# Patient Record
Sex: Male | Born: 1965 | Race: White | Hispanic: No | Marital: Married | Smoking: Never smoker
Health system: Southern US, Community
[De-identification: ages and names within clinical notes are randomized; demographics above are authoritative.]

## PROBLEM LIST (undated history)

## (undated) DIAGNOSIS — N200 Calculus of kidney: Secondary | ICD-10-CM

## (undated) DIAGNOSIS — N400 Enlarged prostate without lower urinary tract symptoms: Secondary | ICD-10-CM

## (undated) DIAGNOSIS — Z87442 Personal history of urinary calculi: Secondary | ICD-10-CM

## (undated) HISTORY — PX: LITHOTRIPSY: SUR834

---

## 2005-01-25 ENCOUNTER — Emergency Department: Payer: Self-pay | Admitting: Emergency Medicine

## 2005-03-21 ENCOUNTER — Ambulatory Visit: Payer: Self-pay | Admitting: Specialist

## 2006-04-20 ENCOUNTER — Emergency Department: Payer: Self-pay | Admitting: Emergency Medicine

## 2006-07-17 IMAGING — CR DG ABDOMEN 1V
1 series · 1 of 1 positions shown · non-contrast
Comparison: none

REASON FOR EXAM: renal calculi-lithotripsy
COMMENTS:

[view not recorded]
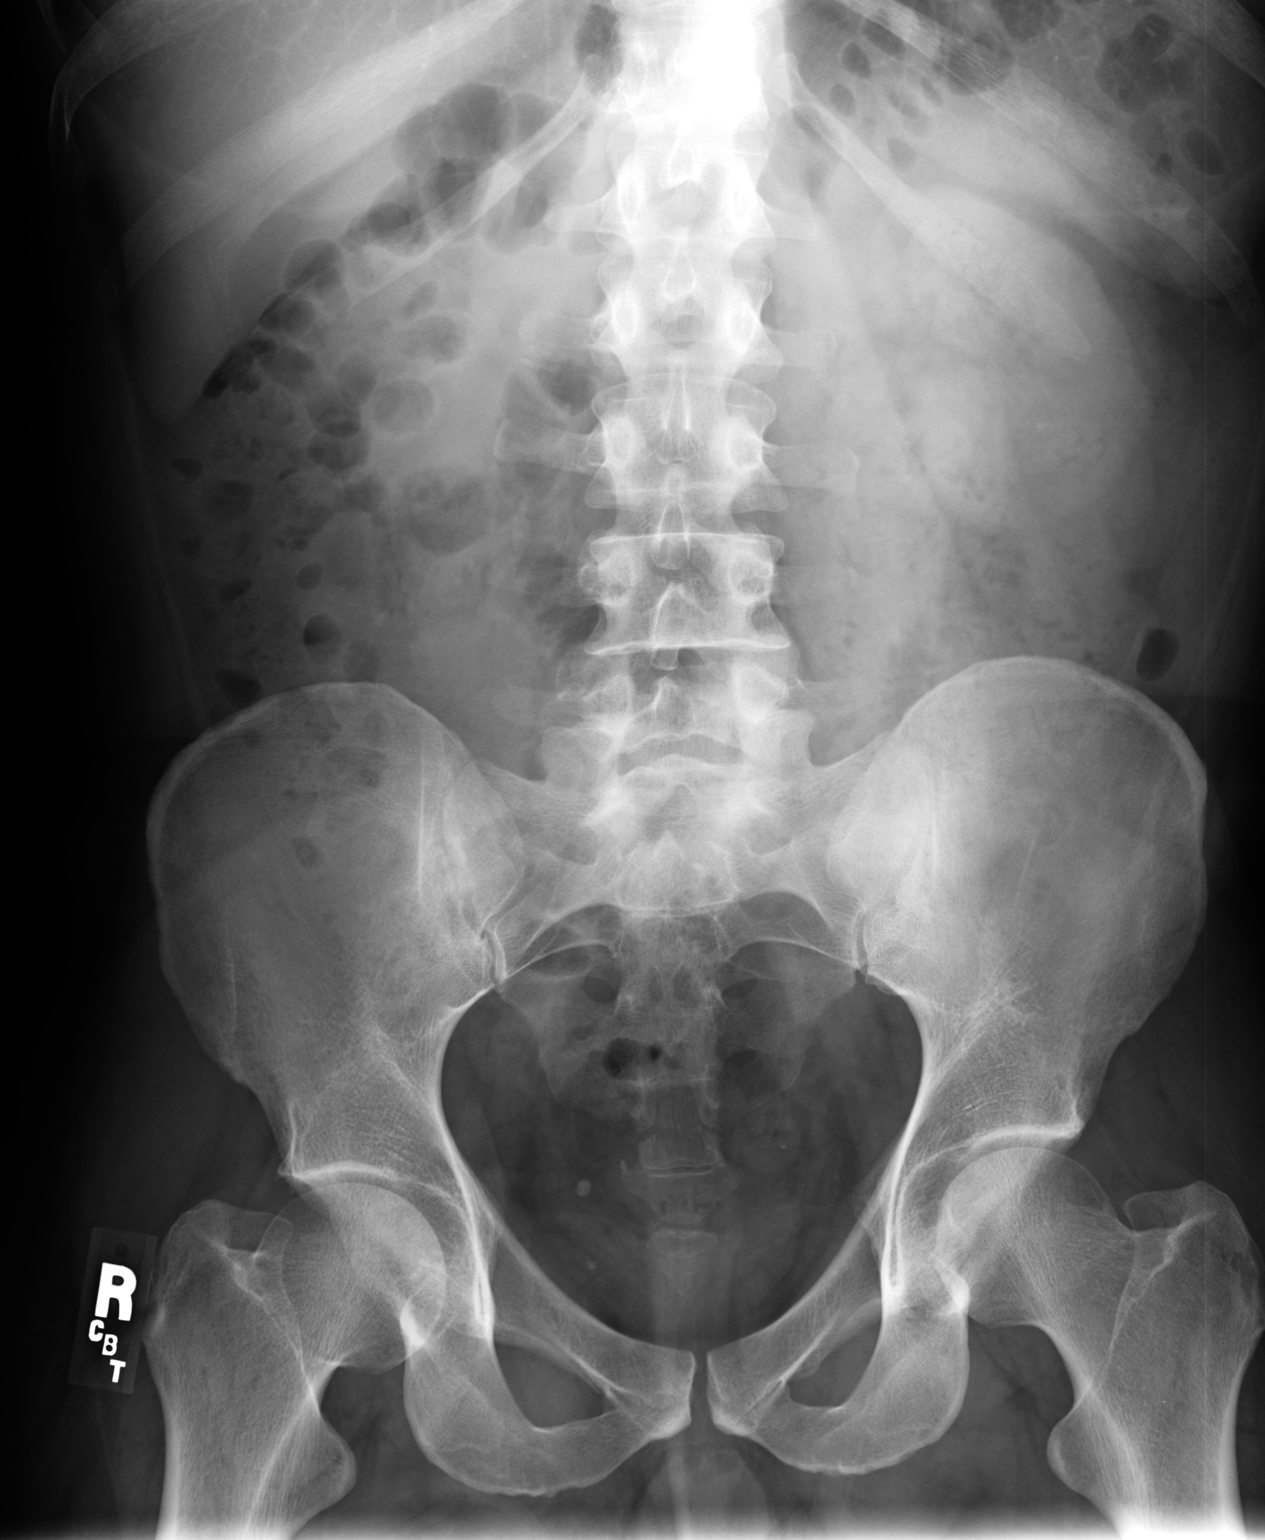

[1 of 1 positions shown; findings below may reference images not displayed]

PROCEDURE:     DXR - DXR KIDNEY URETER BLADDER  - March 21, 2005 [DATE]

RESULT:     A single image demonstrates some calcific densities over the
pelvic region. A definite stone is not demonstrated over the course of the
RIGHT ureter proximally.  The RIGHT kidney is essentially obscured by
overlying bowel content. The LEFT kidney appears normal. The bony structures
are unremarkable. Calcific density over the RIGHT pelvic region could
represent a distal RIGHT ureteral stone. Correlation can be made with CT for
further evaluation if desired.
IMPRESSION: 1)See above.

## 2007-04-20 ENCOUNTER — Ambulatory Visit: Payer: Self-pay | Admitting: Specialist

## 2008-04-19 ENCOUNTER — Ambulatory Visit: Payer: Self-pay | Admitting: Specialist

## 2014-03-25 ENCOUNTER — Ambulatory Visit: Payer: Self-pay | Admitting: Gastroenterology

## 2015-03-15 ENCOUNTER — Ambulatory Visit: Payer: 59 | Attending: Orthopedic Surgery | Admitting: Occupational Therapy

## 2015-03-15 DIAGNOSIS — G5602 Carpal tunnel syndrome, left upper limb: Secondary | ICD-10-CM | POA: Diagnosis present

## 2015-03-15 DIAGNOSIS — G5601 Carpal tunnel syndrome, right upper limb: Secondary | ICD-10-CM | POA: Insufficient documentation

## 2015-03-15 DIAGNOSIS — G5603 Carpal tunnel syndrome, bilateral upper limbs: Secondary | ICD-10-CM

## 2015-03-15 NOTE — Therapy (Signed)
[place] [facility] [address] [place], [address] Phone: [phone]   Fax:  [phone]  Occupational Therapy Treatment  Patient Details  Name: Jeremy Castillo MRN: [id number] Date of Birth: 1966-06-27 Referring Provider:  Kennedy Bucker, MD  Encounter Date: 03/15/2015      OT End of Session - 03/15/15 1207    Visit Number 1   Number of Visits 4   Date for OT Re-Evaluation 04/05/15   OT Start Time 0916   OT Stop Time 1029   OT Time Calculation (min) 73 min   Activity Tolerance Patient tolerated treatment well   Behavior During Therapy [facility] for tasks assessed/performed      No past medical history on file.  No past surgical history on file.  There were no vitals filed for this visit.  Visit Diagnosis:  Bilateral carpal tunnel syndrome - Plan: Ot plan of care cert/re-cert      Subjective Assessment - 03/15/15 1128    Subjective  Had it some what about more than 7 yrs - remember in 2009 I had nerve conduction test    Patient Stated Goals I do not want it to get worse and loose strength or have permanent damage - so Dr Rosita Kea said to do therapy fist and see    Currently in Pain? 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 - did order him wrist support for computer and adjusted chair - notice it mostly sleeping, holding steering wheel , lawn mower, weed eater, driving, shaving    Strength   Right Hand Grip (lbs) 86   Right Hand Lateral Pinch 22 lbs   Right Hand 3 Point Pinch 25 lbs   Left Hand Grip (lbs) 80   Left Hand Lateral Pinch 20 lbs   Left Hand 3 Point Pinch 21 lbs                  OT Treatments/Exercises (OP) - 03/15/15 0001    ADLs   ADL Comments see pt instructions                 OT Education - 03/15/15 1206    Education Details modifications and HEP   Person(s) Educated Patient   Methods Explanation;Demonstration;Tactile cues;Verbal cues;Handout   Comprehension Tactile cues required;Verbal cues required;Returned demonstration;Verbalized understanding          OT Short Term Goals - 03/15/15 1509    OT SHORT TERM GOAL #1   Title Pt to be ind in homeprogram to decrease CTS in bilateral hands    Baseline no knowledge on heP   Time 3   Period Weeks   Status New           OT Long Term Goals - 03/15/15 1510    OT LONG  TERM GOAL #1   Title Pt to verbalize and demo 3 modifications to tasks he did at home or work to decrease symptoms    Time 3   Period Weeks   Status New               Plan - 03/15/15 1208    Clinical Impression Statement Pt present with bilateral CTS that he had for about since 2009  - but got worse the last few months since March - he did do some remodeling at home and had some elbow pain for while and did a lot of sanding, painting - pt has positive test for Phalens , Tinel for Cubital tunnel - pt AROM at wrist and digits WNL - grip  WFL but compare to L decrease - pt report no functional issue and pain at the worse maybe 3/10 = pt was ed on modification of tasks,  what positions to avoid ,  and HEP to do for week and will reassess   Pt will benefit from skilled therapeutic intervention in order to improve on the following deficits (Retired) Pain;Impaired UE functional use;Impaired sensation   Rehab Potential Good   OT Frequency 1x / week   OT Duration 4 weeks   Plan Reassses symptoms for bil  CTS; and HEP   OT Home Exercise Plan see pt instruction   Consulted and Agree with Plan of Care Patient        Problem List There are no active problems to display for this patient.   Oletta Cohn OTR/L,CLT 03/15/2015, 3:14 PM  [place] [facility] [address] [place], [address] Phone: [phone]   Fax:  [phone]

## 2015-03-15 NOTE — Patient Instructions (Signed)
Contrast bath 3 min heat/ 1 min cold x 2-3 and then heat again  Tendon gliding 10 reps Med N and Ulnar  N glides 3-5 reps   Reviewed act and changes to avoid or make to decrease compression on nerve Cubital tunnel - pressure on tunnel; repetitive flexion and extention of elbow Flexion more than 90 degrees - sleeping, resting or reading   CTS - tight and sustained grip - built up handles Grip with flex wrist  Vibration

## 2015-03-23 ENCOUNTER — Ambulatory Visit: Payer: 59 | Attending: Orthopedic Surgery | Admitting: Occupational Therapy

## 2015-03-23 DIAGNOSIS — G5601 Carpal tunnel syndrome, right upper limb: Secondary | ICD-10-CM | POA: Insufficient documentation

## 2015-03-23 DIAGNOSIS — G5603 Carpal tunnel syndrome, bilateral upper limbs: Secondary | ICD-10-CM

## 2015-03-23 DIAGNOSIS — G5602 Carpal tunnel syndrome, left upper limb: Secondary | ICD-10-CM | POA: Diagnosis present

## 2015-03-23 NOTE — Therapy (Signed)
[place] [facility] [address] [place], [address] Phone: [phone]   Fax:  [phone]  Occupational Therapy Treatment  Patient Details  Name: Jeremy Castillo MRN: [id number] Date of Birth: [birth year] Referring Provider:  Kennedy Bucker, MD  Encounter Date: 03/23/2015      OT End of Session - 03/23/15 0946    Visit Number 2   Number of Visits 4   Date for OT Re-Evaluation 04/05/15   OT Start Time 0805   OT Stop Time 0846   OT Time Calculation (min) 41 min   Activity Tolerance Patient tolerated treatment well   Behavior During Therapy [facility] for tasks assessed/performed      No past medical history on file.  No past surgical history on file.  There were no vitals filed for this visit.  Visit Diagnosis:  Bilateral carpal tunnel syndrome      Subjective Assessment - 03/23/15 0940    Subjective  I think I can tell difference - little better - it is just hard to fit in the contrast  in the am - but the water works better than the heatingpad/cold pack - I did get the gel wrist supports for my computer at work    Patient Stated Goals I do not want it to get worse and loose strength or have permanent damage - so Dr Rosita Kea said to do therapy fist and see    Currently in Pain? No/denies                      OT Treatments/Exercises (OP) - 03/23/15 0001    ADLs   ADL Comments Review with pt  modifications for limiiting compression on cubital tunnel and CT    Hand Exercises   Other Hand Exercises Tendon glides 10 reps  , Med and Ulnar N glides 5 reps    Other Hand Exercises Needed min A for glides to do correct    RUE Contrast Bath   Time 11 minutes   Comments to decrease pain and inflammation   LUE Contrast Bath   Time 11 minutes   Comments decrease pain and inflamation   Manual Therapy   Manual therapy comments Carpal spreads done to decrease compresion prior to tendonglides - after contrast                 OT Education - 03/23/15 0946    Education provided Yes   Education Details HEP and modifications    Person(s) Educated Patient   Methods Explanation;Demonstration;Tactile cues;Verbal cues   Comprehension Verbal cues required;Returned demonstration;Verbalized understanding          OT Short Term Goals - 03/15/15 1509    OT SHORT TERM GOAL #1   Title Pt to be ind in homeprogram to decrease CTS in bilateral hands    Baseline no knowledge on heP   Time 3   Period Weeks   Status New           OT Long Term Goals - 03/15/15 1510    OT LONG TERM GOAL #1   Title Pt to verbalize and demo 3 modifications to tasks he did at home or work to decrease symptoms    Time 3   Period Weeks   Status New               Plan - 03/23/15 0946    Clinical Impression Statement Pt report he could feel some improvement,  not a lot and just got his computer gel wrist supports 2 days ago, did not get gloves for cutting lawn yet - ddi need some min A for doing HEP correctly - pt to cont with HEP and modifications for 2 wks and will reassess   Pt will benefit from skilled therapeutic intervention in order to improve on the following deficits (Retired) Pain;Impaired UE functional use;Impaired sensation   Rehab Potential Good   OT Frequency Biweekly   OT Duration 4 weeks   Plan Reassess symptoms if reduce and HEP    OT Home Exercise Plan see pt instruction   Consulted and Agree with Plan of Care Patient        Problem List There are no active problems to display for this patient.   Rayven Hendrickson OTR/L, CLT 03/23/2015, 9:49 AM  [place] [facility] [address], [place], [address] Phone: [phone]   Fax:  [phone]

## 2015-03-23 NOTE — Patient Instructions (Signed)
Same tendon glides and Med and Ulnar N glides   Add carpal spreads prior to ROM   Cont with modifications and contrast

## 2015-03-24 NOTE — Therapy (Signed)
[place] [facility] [address], [place], [address] Phone: [phone]   Fax:  [phone]  Occupational Therapy Treatment  Patient Details  Name: Jeremy Castillo MRN: [id number] Date of Birth: 1965/10/13 Referring Provider:  Kennedy Bucker, MD  Encounter Date: 03/23/2015      OT End of Session - 03/23/15 0946    Visit Number 2   Number of Visits 4   Date for OT Re-Evaluation 04/05/15   OT Start Time 0805   OT Stop Time 0846   OT Time Calculation (min) 41 min   Activity Tolerance Patient tolerated treatment well   Behavior During Therapy [facility] for tasks assessed/performed      No past medical history on file.  No past surgical history on file.  There were no vitals filed for this visit.  Visit Diagnosis:  Bilateral carpal tunnel syndrome      Subjective Assessment - 03/23/15 0940    Subjective  I think I can tell difference - little better - it is just hard to fit in the contrast  in the am - but the water works better than the heatingpad/cold pack - I did get the gel wrist supports for my computer at work    Patient Stated Goals I do not want it to get worse and loose strength or have permanent damage - so Dr Rosita Kea said to do therapy fist and see    Currently in Pain? No/denies                              OT Education - 03/23/15 0946    Education provided Yes   Education Details HEP and modifications    Person(s) Educated Patient   Methods Explanation;Demonstration;Tactile cues;Verbal cues   Comprehension Verbal cues required;Returned demonstration;Verbalized understanding          OT Short Term Goals - 03/15/15 1509    OT SHORT TERM GOAL #1   Title Pt to be ind in homeprogram to decrease CTS in bilateral hands    Baseline no knowledge on heP   Time 3   Period Weeks   Status New           OT Long Term Goals - 03/15/15 1510    OT LONG TERM GOAL #1   Title Pt to  verbalize and demo 3 modifications to tasks he did at home or work to decrease symptoms    Time 3   Period Weeks   Status New               Plan - 03/23/15 0946    Clinical Impression Statement Pt report he could feel some improvement, not a lot and just got his computer gel wrist supports 2 days ago, did not get gloves for cutting lawn yet - ddi need some min A for doing HEP correctly - pt to cont with HEP and modifications for 2 wks and will reassess   Pt will benefit from skilled therapeutic intervention in order to improve on the following deficits (Retired) Pain;Impaired UE functional use;Impaired sensation   Rehab Potential Good   OT Frequency Biweekly   OT Duration 4 weeks   Plan Reassess symptoms if reduce and HEP    OT Home Exercise Plan see pt instruction   Consulted and Agree with Plan of Care Patient       Billing need to be corrected - code [id number]  need to by 8 min instead of 78 min And 1 charge instead of 8 charges  Problem List There are no active problems to display for this patient.   Oletta Cohn 03/24/2015, 12:32 PM  [place] [facility] [address], [place], [address] Phone: [phone]   Fax:  [phone]

## 2015-04-06 ENCOUNTER — Ambulatory Visit: Payer: 59 | Admitting: Occupational Therapy

## 2015-04-06 DIAGNOSIS — G5603 Carpal tunnel syndrome, bilateral upper limbs: Secondary | ICD-10-CM

## 2015-04-06 DIAGNOSIS — G5601 Carpal tunnel syndrome, right upper limb: Secondary | ICD-10-CM | POA: Diagnosis not present

## 2015-04-06 NOTE — Therapy (Signed)
[place] [facility] [address] [place], [address] Phone: [phone]   Fax:  [phone]  Occupational Therapy Treatment  Patient Details  Name: Jeremy Castillo MRN: [id number] Date of Birth: April 01, 1966 Referring Provider:  Kennedy Bucker, MD  Encounter Date: 04/06/2015      OT End of Session - 04/06/15 1442    Visit Number 3   Number of Visits 4   Date for OT Re-Evaluation 04/27/15   OT Start Time 0802   OT Stop Time 0845   OT Time Calculation (min) 43 min   Activity Tolerance Patient tolerated treatment well   Behavior During Therapy [facility] for tasks assessed/performed      No past medical history on file.  No past surgical history on file.  There were no vitals filed for this visit.  Visit Diagnosis:  Bilateral carpal tunnel syndrome - Plan: Ot plan of care cert/re-cert      Subjective Assessment - 04/06/15 1353    Subjective  I was doing great until yesterday when I start feeling little achiness in R wrist and tightness in the forearm -  I  did get the gelpad for my keyboard and mouse -and did not do to much over the weekend - like cutting the lawn or  house work stuff    Patient Stated Goals I do not want it to get worse and loose strength or have permanent damage - so Dr Rosita Kea said to do therapy fist and see    Currently in Pain? No/denies            [facility] OT Assessment - 04/06/15 0001    Strength   Right Hand Grip (lbs) 75   Right Hand Lateral Pinch 19 lbs   Right Hand 3 Point Pinch 21 lbs   Left Hand Grip (lbs) 0   Left Hand Lateral Pinch 25 lbs   Left Hand 3 Point Pinch 20 lbs                  OT Treatments/Exercises (OP) - 04/06/15 0001    ADLs   ADL Comments Review modifications for Cubital tunnel  and CT -   Wrist Exercises   Other wrist exercises Prayer stretch add for tightness on palmar sdie of forearm - wirst extention some what limited in both wrist    Hand  Exercises   Other Hand Exercises Tendon glides 10 reps  , Med and Ulnar N glides 5 reps    Other Hand Exercises Assess strenght to thenar eminence and assess if any atrophy ; grip and prehension assess    Manual Therapy   Manual therapy comments Carpal spreads done to decrease compresion prior to tendonglides - after contrast                OT Education - 04/06/15 1440    Education provided Yes   Education Details HEP and modification   Person(s) Educated Patient   Methods Explanation;Demonstration;Tactile cues;Verbal cues   Comprehension Verbalized understanding;Returned demonstration;Verbal cues required;Tactile cues required          OT Short Term Goals - 04/06/15 1445    OT SHORT TERM GOAL #1   Title Pt to be ind in homeprogram to decrease CTS in bilateral hands    Baseline implementing gradually    Time 3   Period Weeks   Status On-going           OT Long Term Goals - 04/06/15  1446    OT LONG TERM GOAL #1   Title Pt to verbalize and demo 3 modifications to tasks he did at home or work to decrease symptoms    Status Achieved               Plan - 04/06/15 1443    Clinical Impression Statement Pt report feeling some pressure at CT on R more than L , and tightness over flexors in forearm - assess thumb thenar eminence muscles - good stretnght - now it was more rainy the last 2-3 days - could be weather - added some wrist flexor stretch to HEP - pt to cont with contrast , splints nad tendonglides/med N glides - as well as modificastion for  cubital tunnel - pt to cont with home program and changes for 3 wks and will reassess   Pt will benefit from skilled therapeutic intervention in order to improve on the following deficits (Retired) Pain;Impaired UE functional use;Impaired sensation   Rehab Potential Good   OT Frequency Biweekly   OT Duration 4 weeks   Plan Reassess symptoms and changes made    OT Home Exercise Plan see pt instruction   Consulted and  Agree with Plan of Care Patient        Problem List There are no active problems to display for this patient.   Oletta Cohn OTR/L,CLT 04/06/2015, 2:55 PM  [place] [facility] SPORTS MEDIC[facility] [address], [place], [address] Phone: [phone]   Fax:  [phone]

## 2015-04-06 NOTE — Patient Instructions (Signed)
Add wrist flexor stretch and cont with same HEP - splint wearing and modifications

## 2015-04-27 ENCOUNTER — Ambulatory Visit: Payer: 59 | Attending: Orthopedic Surgery | Admitting: Occupational Therapy

## 2015-04-27 DIAGNOSIS — G5603 Carpal tunnel syndrome, bilateral upper limbs: Secondary | ICD-10-CM | POA: Insufficient documentation

## 2015-04-27 NOTE — Patient Instructions (Signed)
See ADL's note for education for modifications   Tendon glides, Med N ulnar N glides   Contrast only when flare up  Sleep with CT splints

## 2015-04-27 NOTE — Therapy (Signed)
[place] [facility] [address] [place], [place], [address] Phone: [phone]   Fax:  [phone]  Occupational Therapy Treatment and discharge  Patient Details  Name: Jeremy Castillo MRN: [id number] Date of Birth: 07/31/1965 Referring Provider:  Kennedy BuckerMenz, Michael, MD  Encounter Date: 04/27/2015      OT End of Session - 04/27/15 1030    Visit Number 4   Number of Visits 4   Date for OT Re-Evaluation 04/27/15   OT Start Time 0810   OT Stop Time 0844   OT Time Calculation (min) 34 min   Activity Tolerance Patient tolerated treatment well   Behavior During [facility] for tasks assessed/performed      No past medical history on file.  No past surgical history on file.  There were no vitals filed for this visit.  Visit Diagnosis:  Bilateral carpal tunnel syndrome      Subjective Assessment - 04/27/15 1023    Subjective  I had rough week last week at work but did not work as much on the computer - did meetings long hours - no pain - maybe 1 x month , and then pressure about 2 x week at wrist - I am sleeping with the splints - trying to avoid the positions or movements that increase symptoms   Patient Stated Goals I do not want it to get worse and loose strength or have permanent damage - so Dr [name] said to do therapy fist and see    Currently in Pain? No/denies            [facility] - 04/27/15 0001    Strength   Right Hand Grip (lbs) 78   Right Hand Lateral Pinch 23 lbs   Right Hand 3 Point Pinch 23 lbs   Left Hand Grip (lbs) 85   Left Hand Lateral Pinch 21 lbs   Left Hand 3 Point Pinch 23 lbs                  OT Treatments/Exercises (OP) - 04/27/15 0001    ADLs   ADL Comments Again discuss with pt positiongs or acvities to modiy for compression on Cubital tunnel  R wores than L , and for bilateral CT - avoid pressure on tunnel, repetitive flexion and exteniton at elbow , flexion with  grip at CT increase and sustained flexion more than 90 degrees at elbow - computer use, cell phone use and doing handy work or lawn care at home    Wrist Exercises   Other wrist exercises reviewed wrist extention prayer stretch - pt forgot about that one    Other wrist exercises measurements for grip and prehension taken - and test for CTS/Cubital tunnel   Hand Exercises   Other Hand Exercises Reviewed 3-5 reps of tendon glides , Med N glide and Ulnar N glide as well asdischarge instrucntion    Other Hand Exercises Assess strenght to thenar eminence and assess if any atrophy ; grip and prehension assess    Manual Therapy   Manual therapy comments Carpal spreads done to decrease compresion prior to tendonglides - after contrast                OT Education - 04/27/15 1029    Education provided Yes   Education Details HEP and modifications    Person(s) Educated Patient   Methods Explanation;Demonstration;Tactile cues;Verbal cues;Handout   Comprehension Returned demonstration;Verbalized understanding;Verbal cues required  OT Short Term Goals - 04/27/15 1033    OT SHORT TERM GOAL #1   Title Pt to be ind in homeprogram to decrease CTS in bilateral hands    Status Achieved           OT Long Term Goals - 04/27/15 1034    OT LONG TERM GOAL #1   Title Pt to verbalize and demo 3 modifications to tasks he did at home or work to decrease symptoms    Status Achieved               Plan - 04/27/15 1030    Clinical Impression Statement Pt report decrease numbness in hands, pain - still at times pressure of CT - pt negative test for  CT but Cubital tunnel positive Tinel and with compression during Phalens test actually - discuss and ed pt on modifications and positions to avoid - pt verbalize understanding and implimented some of them allready - pt aware and ind in homeproram and can be discharge from OT/hand therapy    OT Treatment/Interventions Contrast Bath;Manual  Therapy;Therapeutic exercise;Patient/family education;Splinting   Plan Discharge with home program   OT Home Exercise Plan see pt instruction   Consulted and Agree with Plan of Care Patient        Problem List There are no active problems to display for this patient.   Oletta Cohn OTR/L,CLT 04/27/2015, 10:35 AM  [place] [facility] PHYS[facility] SPORTS MEDICINE [address], [place], [address] Phone: [phone]   Fax:  [phone]

## 2016-06-28 ENCOUNTER — Ambulatory Visit: Payer: Self-pay | Attending: Internal Medicine | Admitting: Occupational Therapy

## 2016-06-28 DIAGNOSIS — M25522 Pain in left elbow: Secondary | ICD-10-CM | POA: Insufficient documentation

## 2016-06-28 NOTE — Therapy (Signed)
[place] [facility] [address] [place], [place], [address] Phone: [phone]   Fax:  [phone]  Patient Details       Occupational Therapy Screen Name: Jeremy MillardRichard Scott Castillo MRN: [id number] Date of Birth: 01/02/1966 Referring Provider:  No ref. provider found  Encounter Date: 06/28/2016 Pt is a 50 y/o RHD male whom presents to out-pt clinic today for an OT Screen secondary to Left Elbow pain. He reports that he has had left elbow pain "for a few months now and I wanted to get it looked at before it got worse". He describes pain along his left lateral epicondyle that wakes him up at night "When I reach down and pull up my covers" causing active grip with left wrist extension.He states that he has tried [employer] on his elbow but this does not relieve his symptoms.  His pain is noted to be reproduced with active wrist extension with grip as well as, with MF extension test on the left and with resisted wrist extension. All pain is referred to his left lateral epicondyle. He was educated to f/u with his PCP for a diagnosis and request a referral for OT to address his current symptoms. In the meantime, he was verbally educated to avoid resisted wrist extension and grip with his left hand (ADL's/IADL's with neutral wrist as able), forearm wrist extensor stretches (with elbow flexed and extended as tolerated), and the option of purchasing a left armband or counterforce brace to assist with pain/symptoms and avoidance of extensor muscle mass area pain. He will also perform gentle massage and cryotherapy for symptom relief until he can f/u with his MD.  Rowe Clackverall,he should benefit from an OT Evaluation and Assessment if MD deems necessary.  Charletta CousinBarnhill, Audwin Semper Beth Dixon, OTR/L 06/28/2016, 8:47 AM  [place] [facility] [address] [place], [place], [address] Phone: [phone]   Fax:   [phone]

## 2016-07-16 ENCOUNTER — Ambulatory Visit: Payer: 59 | Attending: Internal Medicine | Admitting: Occupational Therapy

## 2016-07-16 DIAGNOSIS — M25622 Stiffness of left elbow, not elsewhere classified: Secondary | ICD-10-CM | POA: Insufficient documentation

## 2016-07-16 DIAGNOSIS — M25522 Pain in left elbow: Secondary | ICD-10-CM | POA: Insufficient documentation

## 2016-07-16 DIAGNOSIS — G5603 Carpal tunnel syndrome, bilateral upper limbs: Secondary | ICD-10-CM | POA: Diagnosis not present

## 2016-07-16 NOTE — Therapy (Signed)
[facility] [address], [place], [address] Phone: [phone]   Fax:  [phone]  Occupational Therapy Treatment  Patient Details  Name: Jeremy Castillo MRN: [id number] Date of Birth: [birth year] Referring Provider: Dareen Piano  Encounter Date: 07/16/2016      OT End of Session - 07/16/16 1715    Visit Number 1   Number of Visits 8   Date for OT Re-Evaluation 08/13/16   OT Start Time 0808   OT Stop Time 0900   OT Time Calculation (min) 52 min   Activity Tolerance Patient tolerated treatment well   Behavior During Therapy [facility] for tasks assessed/performed      No past medical history on file.  No past surgical history on file.  There were no vitals filed for this visit.      Subjective Assessment - 07/16/16 1705    Subjective  Symptoms started about 3 months ago, pain with certain movements , at night time - at night the worse and get up to about 5/10 - use phone , pick up something heavy , and pinching tigth    Patient Stated Goals I don't want my elbow  to get worse,  or the pain    Currently in Pain? 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, R hand dominant - likes to camp , play guitar      Strength   Right Hand Grip (lbs) 83  extended arm 80   Right Hand Lateral Pinch 21 lbs   Right Hand 3 Point Pinch 20 lbs   Left Hand Grip (lbs) 75  extended arm 80   Left Hand Lateral Pinch 19 lbs   Left Hand 3 Point Pinch 20 lbs        Heat to L  elbow done 10 min  Cross friction massage all directions  Done and pt ed on  Stretches - extended arm - active to extensors - loose fist  5 x and hold 5 reps  Ice massage   Modifications to compensate - using palm , and not extensors Get counter Oliveri splint for elbow - pt ed on wearing correctly                    OT Education - 07/16/16 1715    Education provided Yes   Education Details Findings, ed on HEP and use of counter Bacus splint    Person(s) Educated Patient   Methods Explanation;Demonstration;Tactile cues;Verbal cues;Handout   Comprehension Verbal cues required;Returned demonstration;Verbalized understanding          OT Short Term Goals - 07/16/16 1719      OT SHORT TERM GOAL #1   Title Pain on PREE improve with 10 points at least    Baseline pain on PREE for eval 15/50   Time 3  Period Weeks   Status New           OT Long Term Goals - 07/16/16 1721      OT LONG TERM GOAL #1   Title Pt to be ind in HEP for stretches, splint wearing and modifications to decrease pain    Baseline very little knowledge   Time 4   Period Weeks   Status New     OT LONG TERM GOAL #2   Title Pt report increase use on PREE by 5 points  without pain more than 1-2/10     Baseline PREE function score 9/50 and pain at the worse 5/10    Time 4   Period Weeks               Plan - 07/16/16 1716    Clinical Impression Statement Pt present with symptoms of R lateral epicondylitis- report he had it for about 3 months - not getting better- pt positive for tender over lateral epicondyle , pain with wrist extention resistance and middle finger extention resistance - grip  strength and extended arm grip strength still WNL compare to R  hand - pt show increase pain , increase stiffness in forearm and elbow - limiting his functional use of L hand in  ADL's and IADL's    Rehab Potential Good   OT Frequency 2x / week   OT Duration 4 weeks   OT Treatment/Interventions  Self-care/ADL training;Moist Heat;Iontophoresis;Ultrasound;Cryotherapy;Manual Therapy;Passive range of motion;Therapeutic exercises;Splinting;Patient/family education   Plan assess progress with HEP    OT Home Exercise Plan see pt instructions   Consulted and Agree with Plan of Care Patient      Patient will benefit from skilled therapeutic intervention in order to improve the following deficits and impairments:  Impaired flexibility, Decreased knowledge of precautions, Impaired UE functional use, Pain, Decreased strength  Visit Diagnosis: Pain in left elbow - Plan: Ot plan of care cert/re-cert  Bilateral carpal tunnel syndrome - Plan: Ot plan of care cert/re-cert  Stiffness of left elbow, not elsewhere classified - Plan: Ot plan of care cert/re-cert    Problem List There are no active problems to display for this patient.   Oletta CohnuPreez, Kathlen Sakurai OTR/L,CLT 07/16/2016, 5:26 PM  [facility] [address] [place], [place], [address] Phone: [phone]   Fax:  [phone]  Name: Jeremy Castillo MRN: [id number] Date of Birth: 02/07/1966

## 2016-07-16 NOTE — Patient Instructions (Signed)
Heat to L elbow  Cross friction massage all directions Stretches - extended arm - active to extensors Ice massage   Modifications to compensate - using palm , and not extensors Get counter Nedved splint for elbow - pt ed on wearing correctly

## 2016-07-19 ENCOUNTER — Ambulatory Visit: Payer: 59 | Admitting: Occupational Therapy

## 2016-07-19 DIAGNOSIS — M25522 Pain in left elbow: Secondary | ICD-10-CM | POA: Diagnosis not present

## 2016-07-19 DIAGNOSIS — M25622 Stiffness of left elbow, not elsewhere classified: Secondary | ICD-10-CM

## 2016-07-19 DIAGNOSIS — G5603 Carpal tunnel syndrome, bilateral upper limbs: Secondary | ICD-10-CM

## 2016-07-19 NOTE — Patient Instructions (Addendum)
Same HEP  

## 2016-07-19 NOTE — Therapy (Signed)
[facility] [address] [place], [place], [address] Phone: [phone]   Fax:  [phone]  Occupational Therapy Treatment  Patient Details  Name: Jeremy MillardRichard Scott Castillo MRN: [id number] Date of Birth: 02/12/1966 Referring Provider: Dareen PianoAnderson  Encounter Date: 07/19/2016      OT End of Session - 07/19/16 1619    Visit Number 2   Number of Visits 8   Date for OT Re-Evaluation 08/13/16   OT Start Time 0805   OT Stop Time 0850   OT Time Calculation (min) 45 min   Activity Tolerance Patient tolerated treatment well   Behavior During Therapy [facility] for tasks assessed/performed      No past medical history on file.  No past surgical history on file.  There were no vitals filed for this visit.      Subjective Assessment - 07/19/16 1615    Subjective  Felt little better since last time - more aware what to do - this am little tender  - I do want to start playing guitar again    Patient Stated Goals I don't want my elbow  to get worse,  or the pain    Currently in Pain? Yes   Pain Score 1    Pain Location Elbow   Pain Orientation Left   Pain Descriptors / Indicators Aching   Pain Type Acute pain                      OT Treatments/Exercises (OP) - 07/19/16 0001      Moist Heat Therapy   Number Minutes Moist Heat 10 Minutes   Moist Heat Location Elbow     Iontophoresis   Type of Iontophoresis Dexamethasone   Location lateral epicondyle - L    Dose med patch, at 2.o current    Time 18 min       After heat done some soft tissue mobs - using Graston tools nr 2 and 4 over dorsal forearm - tender distal to lateral epicondyle  Volar forearm done - with more tightness and brawniness - pt to have long standing CT  Extensor stretches done - extended arm - with loose fist in pronation and neutral position - 5 x 5 sec  ionto done skin check done prior and end   pt ed on what to expect           OT  Education - 07/19/16 1619    Education provided Yes   Education Details HEP reviewed , Research scientist (life sciences)ionto info   Person(s) Educated Patient   Methods Demonstration;Tactile cues;Verbal cues;Explanation   Comprehension Verbalized understanding;Returned demonstration;Verbal cues required          OT Short Term Goals - 07/16/16 1719      OT SHORT TERM GOAL #1   Title Pain on PREE improve with 10 points at least    Baseline pain on PREE for eval 15/50   Time 3   Period Weeks   Status New           OT Long Term Goals - 07/16/16 1721      OT LONG TERM GOAL #1   Title Pt to be ind in HEP for stretches, splint wearing and modifications to decrease pain    Baseline very little knowledge   Time 4   Period Weeks   Status New     OT LONG TERM GOAL #2   Title Pt report increase use on PREE  by 5 points  without pain more than 1-2/10     Baseline PREE function score 9/50 and pain at the worse 5/10    Time 4   Period Weeks               Plan - 07/19/16 1620    Clinical Impression Statement Pt seen for first session - ionto with dexamethazone initiated this date    Rehab Potential Good   OT Frequency 2x / week   OT Duration 4 weeks   OT Treatment/Interventions Self-care/ADL training;Moist Heat;Iontophoresis;Ultrasound;Cryotherapy;Manual Therapy;Passive range of motion;Therapeutic exercises;Splinting;Patient/family education   Plan assess how felt after last time    OT Home Exercise Plan see pt instructions   Consulted and Agree with Plan of Care Patient      Patient will benefit from skilled therapeutic intervention in order to improve the following deficits and impairments:  Impaired flexibility, Decreased knowledge of precautions, Impaired UE functional use, Pain, Decreased strength  Visit Diagnosis: Pain in left elbow  Bilateral carpal tunnel syndrome  Stiffness of left elbow, not elsewhere classified    Problem List There are no active problems to display for this  patient.   Oletta Cohn OTR/L,CLT 07/19/2016, 4:22 PM  [facility] PH[facility]TS [facility]ICINE [address] [place], [address] Phone: [phone]   Fax:  [phone]  Name: Jeremy Castillo MRN: [id number] Date of Birth: 1966/05/25

## 2016-07-23 ENCOUNTER — Ambulatory Visit: Payer: 59 | Admitting: Occupational Therapy

## 2016-07-23 DIAGNOSIS — G5603 Carpal tunnel syndrome, bilateral upper limbs: Secondary | ICD-10-CM

## 2016-07-23 DIAGNOSIS — M25522 Pain in left elbow: Secondary | ICD-10-CM | POA: Diagnosis not present

## 2016-07-23 DIAGNOSIS — M25622 Stiffness of left elbow, not elsewhere classified: Secondary | ICD-10-CM

## 2016-07-23 NOTE — Patient Instructions (Signed)
Same HEP  

## 2016-07-23 NOTE — Therapy (Signed)
[facility] [address] [place], [place], [address] Phone: [phone]   Fax:  [phone]  Occupational Therapy Treatment  Patient Details  Name: Jeremy Castillo MRN: [id number] Date of Birth: 10/11/1965 Referring Provider: Dareen PianoAnderson  Encounter Date: 07/23/2016      OT End of Session - 07/23/16 0950    Visit Number 3   Number of Visits 8   Date for OT Re-Evaluation 08/13/16   OT Start Time 0810   OT Stop Time 0859   OT Time Calculation (min) 49 min   Activity Tolerance Patient tolerated treatment well   Behavior During Therapy [facility] for tasks assessed/performed      No past medical history on file.  No past surgical history on file.  There were no vitals filed for this visit.      Subjective Assessment - 07/23/16 0821    Subjective  At night I feel itt little more - still tender when you touch - but I massage it -    Patient Stated Goals I don't want my elbow  to get worse,  or the pain    Currently in Pain? Yes   Pain Score 1    Pain Location Elbow   Pain Orientation Left   Pain Descriptors / Indicators Aching                      OT Treatments/Exercises (OP) - 07/23/16 0001      Moist Heat Therapy   Number Minutes Moist Heat 10 Minutes   Moist Heat Location Elbow     Iontophoresis   Type of Iontophoresis Dexamethasone   Location lateral epicondyle - L    Dose med patch, at 2.o current    Time 18 min       After heat done some soft tissue mobs - using Graston tools nr 2 and 4 over dorsal forearm - tender distal to lateral epicondyle  Volar forearm done - with more tightness and brawniness - pt to have long standing CT  Extensor stretches done - extended arm - with loose fist in pronation and neutral position - 5 x 5 sec  ionto done skin check done prior   pt ed on what to expect and ed on keeping patch on for hour afterwards   Ed on modifications - pt is starting to play  more guitar              OT Education - 07/23/16 0950    Education provided Yes   Education Details HEP same, and ionto use   Person(s) Educated Patient   Methods Explanation;Demonstration;Tactile cues;Verbal cues   Comprehension Verbal cues required;Returned demonstration;Verbalized understanding          OT Short Term Goals - 07/16/16 1719      OT SHORT TERM GOAL #1   Title Pain on PREE improve with 10 points at least    Baseline pain on PREE for eval 15/50   Time 3   Period Weeks   Status New           OT Long Term Goals - 07/16/16 1721      OT LONG TERM GOAL #1   Title Pt to be ind in HEP for stretches, splint wearing and modifications to decrease pain    Baseline very little knowledge   Time 4   Period Weeks   Status New     OT LONG TERM GOAL #2  Title Pt report increase use on PREE by 5 points  without pain more than 1-2/10     Baseline PREE function score 9/50 and pain at the worse 5/10    Time 4   Period Weeks               Plan - 07/23/16 0951    Clinical Impression Statement Pt had 2nd session of ionto this date - still some pain at elbow - tender to touch at lateral epicondyle - pain at the worse 3/10   Rehab Potential Good   OT Frequency 2x / week   OT Duration 4 weeks   OT Treatment/Interventions Self-care/ADL training;Moist Heat;Iontophoresis;Ultrasound;Cryotherapy;Manual Therapy;Passive range of motion;Therapeutic exercises;Splinting;Patient/family education   Plan any improvements ?    OT Home Exercise Plan see pt instructions   Consulted and Agree with Plan of Care Patient      Patient will benefit from skilled therapeutic intervention in order to improve the following deficits and impairments:  Impaired flexibility, Decreased knowledge of precautions, Impaired UE functional use, Pain, Decreased strength  Visit Diagnosis: Pain in left elbow  Bilateral carpal tunnel syndrome  Stiffness of left elbow, not elsewhere  classified    Problem List There are no active problems to display for this patient.   Oletta Cohn OTR/L,CLT 07/23/2016, 9:54 AM   [facility] PHYSICAL AND SPORTS MEDICINE [address] [place], [address] Phone: [phone]   Fax:  [phone]  Name: Jeremy Castillo MRN: [id number] Date of Birth: December 23, 1965

## 2016-07-25 ENCOUNTER — Ambulatory Visit: Payer: 59 | Admitting: Occupational Therapy

## 2016-07-25 DIAGNOSIS — M25622 Stiffness of left elbow, not elsewhere classified: Secondary | ICD-10-CM

## 2016-07-25 DIAGNOSIS — M25522 Pain in left elbow: Secondary | ICD-10-CM

## 2016-07-25 DIAGNOSIS — G5603 Carpal tunnel syndrome, bilateral upper limbs: Secondary | ICD-10-CM

## 2016-07-25 NOTE — Therapy (Signed)
[facility] [address] [place], [place], [address] Phone: [phone]   Fax:  [phone]  Occupational Therapy Treatment  Patient Details  Name: Jeremy Castillo MRN: [id number] Date of Birth: 03/18/1966 Referring Provider: Dareen PianoAnderson  Encounter Date: 07/25/2016      OT End of Session - 07/25/16 0841    Visit Number 4   Number of Visits 8   Date for OT Re-Evaluation 08/13/16   OT Start Time 0809   OT Stop Time 0855   OT Time Calculation (min) 46 min   Activity Tolerance Patient tolerated treatment well   Behavior During Therapy [facility] for tasks assessed/performed      No past medical history on file.  No past surgical history on file.  There were no vitals filed for this visit.      Subjective Assessment - 07/25/16 0818    Subjective  I really had a great night and yesterday - did not feel elbow this am and night   Patient Stated Goals I don't want my elbow  to get worse,  or the pain    Currently in Pain? No/denies                      OT Treatments/Exercises (OP) - 07/25/16 0001      Moist Heat Therapy   Number Minutes Moist Heat 10 Minutes   Moist Heat Location Elbow      After heat done some soft tissue mobs - using Graston tools nr 2 and 4 over dorsal forearm - tender distal to lateral epicondyle  Volar forearm done - with more tightness and brawniness - some redness distal volar wrist -  pt do have long standing CT  Extensor stretches done - extended arm - with loose fist in pronation and neutral position - 5 x 5 sec  Joint mobs to proximal radius head - 5 reps  And gentle traction   ionto done- skin check done prior  Ionto on lateral epicondyle - - medium patch - 2.0 current  19 min  pt ed on what to expect again and ed on keeping patch on for hour afterwards( forgot it last time)   Ed on modifications - pt is starting to play more guitar            OT  Education - 07/25/16 0841    Education provided Yes   Education Details HEP same   Starwood HotelsPerson(s) Educated Patient   Methods Explanation;Demonstration;Tactile cues;Verbal cues   Comprehension Verbal cues required;Returned demonstration;Verbalized understanding          OT Short Term Goals - 07/16/16 1719      OT SHORT TERM GOAL #1   Title Pain on PREE improve with 10 points at least    Baseline pain on PREE for eval 15/50   Time 3   Period Weeks   Status New           OT Long Term Goals - 07/16/16 1721      OT LONG TERM GOAL #1   Title Pt to be ind in HEP for stretches, splint wearing and modifications to decrease pain    Baseline very little knowledge   Time 4   Period Weeks   Status New     OT LONG TERM GOAL #2   Title Pt report increase use on PREE by 5 points  without pain more than 1-2/10     Baseline PREE  function score 9/50 and pain at the worse 5/10    Time 4   Period Weeks               Plan - 07/25/16 1610    Clinical Impression Statement Pt report no pain last night or this am -  and did play some guitar over the last 2 days - was little sore again after session - had 3rd session of ionto this date   Rehab Potential Good   OT Frequency 2x / week   OT Duration 4 weeks   OT Treatment/Interventions Self-care/ADL training;Moist Heat;Iontophoresis;Ultrasound;Cryotherapy;Manual Therapy;Passive range of motion;Therapeutic exercises;Splinting;Patient/family education   Plan reassess grip again   OT Home Exercise Plan see pt instructions   Consulted and Agree with Plan of Care Patient      Patient will benefit from skilled therapeutic intervention in order to improve the following deficits and impairments:  Impaired flexibility, Decreased knowledge of precautions, Impaired UE functional use, Pain, Decreased strength  Visit Diagnosis: Pain in left elbow  Bilateral carpal tunnel syndrome  Stiffness of left elbow, not elsewhere classified    Problem  List There are no active problems to display for this patient.   Oletta Cohn OTR/L,CLT 07/25/2016, 10:20 AM  [facility] [facility] PHYSICAL AND SPORTS MEDICINE [address], [place], [address] Phone: [phone]   Fax:  [phone]  Name: Jeremy Castillo MRN: [id number] Date of Birth: 04/25/1966

## 2016-07-25 NOTE — Patient Instructions (Addendum)
Same HEP  

## 2016-07-30 ENCOUNTER — Ambulatory Visit: Payer: 59 | Admitting: Occupational Therapy

## 2016-07-30 DIAGNOSIS — M25522 Pain in left elbow: Secondary | ICD-10-CM

## 2016-07-30 DIAGNOSIS — M25622 Stiffness of left elbow, not elsewhere classified: Secondary | ICD-10-CM

## 2016-07-30 DIAGNOSIS — G5603 Carpal tunnel syndrome, bilateral upper limbs: Secondary | ICD-10-CM

## 2016-07-30 NOTE — Patient Instructions (Addendum)
Same HEP  

## 2016-07-30 NOTE — Therapy (Signed)
[facility] [address] [place], [place], [address] Phone: [phone]   Fax:  [phone]  Occupational Therapy Treatment  Patient Details  Name: Jeremy MillardRichard Scott Hajduk MRN: [id number] Date of Birth: 02/20/1966 Referring Provider: Dareen PianoAnderson  Encounter Date: 07/30/2016      OT End of Session - 07/30/16 0851    Visit Number 5   Number of Visits 8   Date for OT Re-Evaluation 08/13/16   OT Start Time 0812   OT Stop Time 0859   OT Time Calculation (min) 47 min   Activity Tolerance Patient tolerated treatment well   Behavior During Therapy [facility] for tasks assessed/performed      No past medical history on file.  No past surgical history on file.  There were no vitals filed for this visit.      Subjective Assessment - 07/30/16 0845    Subjective  I can tell difference in intensity is better and not as often - pain is getting better    Patient Stated Goals I don't want my elbow  to get worse,  or the pain    Currently in Pain? Yes   Pain Score 1    Pain Location Elbow   Pain Orientation Left   Pain Descriptors / Indicators Aching                      OT Treatments/Exercises (OP) - 07/30/16 0001      Moist Heat Therapy   Number Minutes Moist Heat 10 Minutes   Moist Heat Location Elbow     Iontophoresis   Type of Iontophoresis Dexamethasone   Location lateral epicondyle - L    Dose med patch, at 2.o current    Time 19 min      After heat done some soft tissue mobs - using Graston tools nr 2 and 4 over dorsal forearm - tender distal to lateral epicondyle  Volar forearm done - with more tightness and brawniness - some redness distal volar wrist -  pt do have long standing CT Cross friction massage to lateral epicondyle   Extensor stretches done - extended arm - with loose fist in pronation and neutral position - 5 x 5 sec  - active and passive Joint mobs to proximal radius head - 5 reps   And gentle traction   ionto done- skin check done prior  Ionto on lateral epicondyle - - medium patch - 2.0 current  19 min  pt ed on what to expect again and ed on keeping patch on for hour afterwards( forgot it last time)   Ed on modifications - pt is starting to play more guitar              OT Education - 07/30/16 0851    Education provided Yes   Education Details HEP same   Starwood HotelsPerson(s) Educated Patient   Methods Explanation;Demonstration;Tactile cues;Verbal cues;Handout   Comprehension Verbal cues required;Returned demonstration;Verbalized understanding          OT Short Term Goals - 07/16/16 1719      OT SHORT TERM GOAL #1   Title Pain on PREE improve with 10 points at least    Baseline pain on PREE for eval 15/50   Time 3   Period Weeks   Status New           OT Long Term Goals - 07/16/16 1721      OT LONG TERM GOAL #1  Title Pt to be ind in HEP for stretches, splint wearing and modifications to decrease pain    Baseline very little knowledge   Time 4   Period Weeks   Status New     OT LONG TERM GOAL #2   Title Pt report increase use on PREE by 5 points  without pain more than 1-2/10     Baseline PREE function score 9/50 and pain at the worse 5/10    Time 4   Period Weeks               Plan - 07/30/16 0855    Clinical Impression Statement Pt report decrease pain intensity and fequency - but still tender and pain with certain movements   Rehab Potential Good   OT Frequency 2x / week   OT Duration 4 weeks   OT Treatment/Interventions Self-care/ADL training;Moist Heat;Iontophoresis;Ultrasound;Cryotherapy;Manual Therapy;Passive range of motion;Therapeutic exercises;Splinting;Patient/family education   Plan Reassess grip - reassess PREE- cont ionto   OT Home Exercise Plan see pt instructions   Consulted and Agree with Plan of Care Patient      Patient will benefit from skilled therapeutic intervention in order to improve the  following deficits and impairments:  Impaired flexibility, Decreased knowledge of precautions, Impaired UE functional use, Pain, Decreased strength  Visit Diagnosis: Pain in left elbow  Bilateral carpal tunnel syndrome  Stiffness of left elbow, not elsewhere classified    Problem List There are no active problems to display for this patient.   Oletta Cohn OTR/L,CLT 07/30/2016, 11:40 AM  [facility] PHYSICAL AND SPORTS MEDICINE [address], [place], [address] Phone: [phone]   Fax:  [phone]  Name: Avrian Delfavero Driscoll MRN: [id number] Date of Birth: 05/26/1966

## 2016-08-01 ENCOUNTER — Ambulatory Visit: Payer: 59 | Admitting: Occupational Therapy

## 2016-08-06 ENCOUNTER — Ambulatory Visit: Payer: 59 | Admitting: Occupational Therapy

## 2016-08-06 DIAGNOSIS — G5603 Carpal tunnel syndrome, bilateral upper limbs: Secondary | ICD-10-CM

## 2016-08-06 DIAGNOSIS — M25522 Pain in left elbow: Secondary | ICD-10-CM

## 2016-08-06 DIAGNOSIS — M25622 Stiffness of left elbow, not elsewhere classified: Secondary | ICD-10-CM

## 2016-08-06 NOTE — Patient Instructions (Addendum)
Same - but change stretch to Passive

## 2016-08-06 NOTE — Therapy (Signed)
[facility] [address], [place], [address] Phone: [phone]   Fax:  [phone]  Occupational Therapy Treatment  Patient Details  Name: Jeremy Castillo MRN: [id number] Date of Birth: [birth year] Referring Provider: Dareen Piano  Encounter Date: 08/06/2016      OT End of Session - 08/06/16 0845    Visit Number 6   Number of Visits 8   Date for OT Re-Evaluation 08/13/16   OT Start Time 0805   OT Stop Time 0850   OT Time Calculation (min) 45 min   Activity Tolerance Patient tolerated treatment well   Behavior During Therapy [facility] for tasks assessed/performed      No past medical history on file.  No past surgical history on file.  There were no vitals filed for this visit.      Subjective Assessment - 08/06/16 0841    Subjective  pain is down to 2/10 - in the morning I feel it - putting my coat on - during day not really bothering me - it was 4/10    Patient Stated Goals I don't want my elbow  to get worse,  or the pain    Currently in Pain? Yes   Pain Score 2    Pain Location Elbow   Pain Orientation Left   Pain Descriptors / Indicators Aching            [facility] OT Assessment - 08/06/16 0001      Strength   Left Hand Grip (lbs) 80  75 extended arm                   OT Treatments/Exercises (OP) - 08/06/16 0001      Moist Heat Therapy   Number Minutes Moist Heat 10 Minutes   Moist Heat Location Elbow     Iontophoresis   Type of Iontophoresis Dexamethasone   Location lateral epicondyle - L    Dose med patch, at 2.o current    Time 19      After heat done some soft tissue mobs - using Graston tools nr 2 and 4 over dorsal forearm - tender distal to lateral epicondyle  Volar forearm done - with more tightness and brawniness - some redness distal volar wrist - pt do have long standing CT Cross friction massage to lateral epicondyle   Extensor stretches done - extended  arm - with loose fist in pronation and neutral position - 5 x 5 sec  - passive done - and add to HEP Joint mobs to proximal radius head - 5 reps  And gentle traction   ionto done- skin check done prior  Ionto on lateral epicondyle - - medium patch - 2.0 current  19 min  pt ed on what to expect again and ed on keeping patch on for hour afterwards( forgot it last time)              OT Education - 08/06/16 0844    Education provided Yes   Education Details HEP review   Person(s) Educated Patient   Methods Explanation;Demonstration;Tactile cues;Verbal cues   Comprehension Verbalized understanding;Returned demonstration;Verbal cues required          OT Short Term Goals - 07/16/16 1719      OT SHORT TERM GOAL #1   Title Pain on PREE improve with 10 points at least    Baseline pain on PREE for eval 15/50   Time 3   Period Weeks  Status New           OT Long Term Goals - 07/16/16 1721      OT LONG TERM GOAL #1   Title Pt to be ind in HEP for stretches, splint wearing and modifications to decrease pain    Baseline very little knowledge   Time 4   Period Weeks   Status New     OT LONG TERM GOAL #2   Title Pt report increase use on PREE by 5 points  without pain more than 1-2/10     Baseline PREE function score 9/50 and pain at the worse 5/10    Time 4   Period Weeks               Plan - 08/06/16 0848    Clinical Impression Statement Pt cont to show decrease pain - still tenderness at lateral epincondyle and some discomfort with middle finger extention test - grip still great - but pain about 2/10   Rehab Potential Good   OT Frequency 2x / week   OT Duration 4 weeks   OT Treatment/Interventions Self-care/ADL training;Moist Heat;Iontophoresis;Ultrasound;Cryotherapy;Manual Therapy;Passive range of motion;Therapeutic exercises;Splinting;Patient/family education   Plan assess progress in pain with functional use   OT Home Exercise Plan see pt  instructions   Consulted and Agree with Plan of Care Patient      Patient will benefit from skilled therapeutic intervention in order to improve the following deficits and impairments:  Impaired flexibility, Decreased knowledge of precautions, Impaired UE functional use, Pain, Decreased strength  Visit Diagnosis: Pain in left elbow  Bilateral carpal tunnel syndrome  Stiffness of left elbow, not elsewhere classified    Problem List There are no active problems to display for this patient.   Oletta CohnuPreez, Navjot Pilgrim OTR/L,CLT 08/06/2016, 4:22 PM  [place] [facility] [address] [place], [place], [address] Phone: [phone]   Fax:  [phone]  Name: Jeremy MillardRichard Scott Castillo MRN: [id number] Date of Birth: 02/24/1966

## 2016-08-08 ENCOUNTER — Ambulatory Visit: Payer: 59 | Admitting: Occupational Therapy

## 2016-08-08 DIAGNOSIS — M25522 Pain in left elbow: Secondary | ICD-10-CM | POA: Diagnosis not present

## 2016-08-08 DIAGNOSIS — G5603 Carpal tunnel syndrome, bilateral upper limbs: Secondary | ICD-10-CM

## 2016-08-08 DIAGNOSIS — M25622 Stiffness of left elbow, not elsewhere classified: Secondary | ICD-10-CM

## 2016-08-08 NOTE — Patient Instructions (Addendum)
Same HEP  

## 2016-08-08 NOTE — Therapy (Signed)
[place] [facility] [address] [place], [address] Phone: [phone]   Fax:  [phone]  Occupational Therapy Treatment  Patient Details  Name: Jeremy Castillo MRN: [id number] Date of Birth: 29-May-1966 Referring Provider: Dareen Piano  Encounter Date: 08/08/2016      OT End of Session - 08/08/16 0821    Visit Number 7   Number of Visits 8   Date for OT Re-Evaluation 08/13/16   OT Start Time 0806   OT Stop Time 0854   OT Time Calculation (min) 48 min   Activity Tolerance Patient tolerated treatment well   Behavior During Therapy [facility] for tasks assessed/performed      No past medical history on file.  No past surgical history on file.  There were no vitals filed for this visit.      Subjective Assessment - 08/08/16 0812    Subjective  I check in the am my arm stiff and feel some pain 3/10 at the worse- but during day not really feeling it - except when putting on coat and pick up drink/scrunch paper    Patient Stated Goals I don't want my elbow  to get worse,  or the pain    Currently in Pain? Yes   Pain Score 2    Pain Orientation Left   Pain Descriptors / Indicators Aching   Pain Type Acute pain                      OT Treatments/Exercises (OP) - 08/08/16 0001      Moist Heat Therapy   Number Minutes Moist Heat 10 Minutes   Moist Heat Location Elbow     Iontophoresis   Type of Iontophoresis Dexamethasone   Location lateral epicondyle - L    Dose med patch, at 2.o current    Time 19      After heat done some soft tissue mobs - using Graston tools nr 2 and 4 over dorsal forearm - tender distal to lateral epicondyle  Volar forearm done - with more tightness and brawniness - some redness distal volar wrist - pt do have long standing CT Cross friction massage to lateral epicondyle   Extensor stretches done - extended arm - with loose fist in pronation and neutral position - 5 x  5 sec - passive done - and add to HEP Joint mobs to proximal radius head - 5 reps  And gentle traction   ionto done- skin check done prior  Ionto on lateral epicondyle - - medium patch - 2.0 current  19 min               OT Education - 08/08/16 0820    Education provided Yes   Education Details HEP review   Person(s) Educated Patient   Methods Explanation;Demonstration;Tactile cues;Verbal cues   Comprehension Verbal cues required;Returned demonstration;Verbalized understanding          OT Short Term Goals - 08/08/16 1940      OT SHORT TERM GOAL #1   Title Pain on PREE improve with 10 points at least    Baseline pain on PREE for eval 15/50   Time 3   Period Weeks   Status On-going           OT Long Term Goals - 08/08/16 1940      OT LONG TERM GOAL #1   Title Pt to be ind in HEP for stretches, splint wearing and  modifications to decrease pain    Status Achieved     OT LONG TERM GOAL #2   Title Pt report increase use on PREE by 5 points  without pain more than 1-2/10     Baseline PREE function score 9/50 and pain at the worse 5/10    Time 3   Period Weeks   Status On-going               Plan - 08/08/16 14780821    Clinical Impression Statement Pt show but steady showing improvement - pain in am - but more stiffness and 2-3/10 - tenderness was better today    Rehab Potential Good   OT Frequency 2x / week   OT Duration 4 weeks   OT Treatment/Interventions Self-care/ADL training;Moist Heat;Iontophoresis;Ultrasound;Cryotherapy;Manual Therapy;Passive range of motion;Therapeutic exercises;Splinting;Patient/family education   OT Home Exercise Plan see pt instructions   Consulted and Agree with Plan of Care Patient      Patient will benefit from skilled therapeutic intervention in order to improve the following deficits and impairments:  Impaired flexibility, Decreased knowledge of precautions, Impaired UE functional use, Pain, Decreased  strength  Visit Diagnosis: Pain in left elbow  Bilateral carpal tunnel syndrome  Stiffness of left elbow, not elsewhere classified    Problem List There are no active problems to display for this patient.   Oletta CohnuPreez, Meryl Ponder OTR/L,CLT 08/08/2016, 7:41 PM   [facility] PH[facility] SPORTS MEDICINE [address] [place], [place], [address] Phone: [phone]   Fax:  [phone]  Name: Jeremy Castillo MRN: [id number] Date of Birth: 06/28/1966

## 2016-08-13 ENCOUNTER — Ambulatory Visit: Payer: 59 | Admitting: Occupational Therapy

## 2016-08-13 DIAGNOSIS — M25522 Pain in left elbow: Secondary | ICD-10-CM | POA: Diagnosis not present

## 2016-08-13 DIAGNOSIS — M25622 Stiffness of left elbow, not elsewhere classified: Secondary | ICD-10-CM

## 2016-08-13 DIAGNOSIS — G5603 Carpal tunnel syndrome, bilateral upper limbs: Secondary | ICD-10-CM

## 2016-08-13 NOTE — Patient Instructions (Signed)
Same HEP  

## 2016-08-13 NOTE — Therapy (Signed)
[place] [facility] [address] [place], [address] Phone: [phone]   Fax:  [phone]  Occupational Therapy Treatment  Patient Details  Name: Jeremy Castillo MRN: [id number] Date of Birth: 1966/07/04 Referring Provider: Dareen Piano  Encounter Date: 08/13/2016      OT End of Session - 08/13/16 0824    Visit Number 8   Number of Visits 8   Date for OT Re-Evaluation 08/13/16   OT Start Time 0810   OT Stop Time 0905   OT Time Calculation (min) 55 min   Activity Tolerance Patient tolerated treatment well   Behavior During Therapy [facility] for tasks assessed/performed      No past medical history on file.  No past surgical history on file.  There were no vitals filed for this visit.      Subjective Assessment - 08/13/16 0821    Subjective  It is getting better - the last few night I did not feel it - stiff in the am - but after moving it - it is good - even put together a desk yesterday -no increase pain    Patient Stated Goals I don't want my elbow  to get worse,  or the pain    Currently in Pain? No/denies                      OT Treatments/Exercises (OP) - 08/13/16 0001      Moist Heat Therapy   Number Minutes Moist Heat 10 Minutes   Moist Heat Location Elbow     Iontophoresis   Type of Iontophoresis Dexamethasone   Location lateral epicondyle - L    Dose med patch, at 2.o current    Time 19      Less pain with palpation and 3rd /wrist extention with resistance  But pain with picking up wide grip with extended arm and pick up with elbow flexion - but  About 2/10 at the worse   After heat done some soft tissue mobs - using Graston tools nr 2  over dorsal forearm - less tenderness Cross friction massage to lateral epicondyle  - pt to cont at home   Extensor stretches done - extended arm - with loose fist in pronation and neutral position - 5 x 5 sec - passive done -and to cont  with at home Joint mobs to proximal radius head - 5 reps  And gentle traction  - no pain this date   ionto done- skin check done prior  Ionto on lateral epicondyle - - medium patch - 2.0 current  19 min            OT Education - 08/13/16 0824    Education provided Yes   Person(s) Educated Patient   Methods Explanation;Demonstration;Tactile cues;Verbal cues   Comprehension Verbal cues required;Returned demonstration;Verbalized understanding          OT Short Term Goals - 08/08/16 1940      OT SHORT TERM GOAL #1   Title Pain on PREE improve with 10 points at least    Baseline pain on PREE for eval 15/50   Time 3   Period Weeks   Status On-going           OT Long Term Goals - 08/08/16 1940      OT LONG TERM GOAL #1   Title Pt to be ind in HEP for stretches, splint wearing and modifications to decrease pain  Status Achieved     OT LONG TERM GOAL #2   Title Pt report increase use on PREE by 5 points  without pain more than 1-2/10     Baseline PREE function score 9/50 and pain at the worse 5/10    Time 3   Period Weeks   Status On-going               Plan - 08/13/16 0825    Clinical Impression Statement Pt made great progress in pain - and tenderness at night time and during every day use - still some stiffness but pain better - possible discharge next session    Rehab Potential Good   OT Frequency 2x / week   OT Duration 4 weeks   OT Treatment/Interventions Self-care/ADL training;Moist Heat;Iontophoresis;Ultrasound;Cryotherapy;Manual Therapy;Passive range of motion;Therapeutic exercises;Splinting;Patient/family education   Plan assess if can discharge - PREE to be done    OT Home Exercise Plan see pt instructions   Consulted and Agree with Plan of Care Patient      Patient will benefit from skilled therapeutic intervention in order to improve the following deficits and impairments:  Impaired flexibility, Decreased knowledge of precautions,  Impaired UE functional use, Pain, Decreased strength  Visit Diagnosis: Pain in left elbow  Bilateral carpal tunnel syndrome  Stiffness of left elbow, not elsewhere classified    Problem List There are no active problems to display for this patient.   Oletta CohnuPreez, Bricen Victory OTR/L,CLT  08/13/2016, 12:54 PM  [place] [facility] [address] [place], [place], [address] Phone: [phone]   Fax:  [phone]  Name: Jeremy Castillo MRN: [id number] Date of Birth: 05/12/1966

## 2016-08-15 ENCOUNTER — Ambulatory Visit: Payer: 59 | Attending: Internal Medicine | Admitting: Occupational Therapy

## 2016-08-15 DIAGNOSIS — M25522 Pain in left elbow: Secondary | ICD-10-CM

## 2016-08-15 DIAGNOSIS — G5603 Carpal tunnel syndrome, bilateral upper limbs: Secondary | ICD-10-CM | POA: Diagnosis not present

## 2016-08-15 DIAGNOSIS — M25622 Stiffness of left elbow, not elsewhere classified: Secondary | ICD-10-CM

## 2016-08-15 NOTE — Patient Instructions (Signed)
  Same HEP to cont for discharge

## 2016-08-15 NOTE — Therapy (Signed)
[place] [facility] [address] [place], [place], [address] Phone: [phone]   Fax:  [phone]  Occupational Therapy Treatment  Patient Details  Name: Jeremy MillardRichard Scott Castillo MRN: [id number] Date of Birth: 10/23/1965 Referring Provider: Dareen PianoAnderson  Encounter Date: 08/15/2016      OT End of Session - 08/15/16 0821    Visit Number 9   Number of Visits 9   Date for OT Re-Evaluation 08/15/16   OT Start Time 0805   OT Stop Time 0901   OT Time Calculation (min) 56 min   Activity Tolerance Patient tolerated treatment well   Behavior During Therapy [facility] for tasks assessed/performed      No past medical history on file.  No past surgical history on file.  There were no vitals filed for this visit.      Subjective Assessment - 08/15/16 0840    Subjective  It is so much better - I feel little stiffness some mornings and maybe a twinch - but then it is gone - and I know now what to do - my heat , massage and stretches - thank you    Patient Stated Goals I don't want my elbow  to get worse,  or the pain    Currently in Pain? No/denies            [facility] - 08/15/16 0001      Strength   Right Hand Grip (lbs) 83   Left Hand Grip (lbs) 80  85 extended      PREE done for outcome measure - pain and function   improve to 4/50 pain and 1/50 function  Grip improved to 80 and 85 - arm to side and extended  Tenderness less    Less pain with palpation and 3rd /wrist extention with resistance   After heat done some soft tissue mobs - using Graston tools nr 2  over dorsal forearm - less tenderness Cross friction massage to lateral epicondyle  - pt to cont at home   Extensor stretches done - extended arm  PROM- with loose fist in pronation and neutral position - 5 x 5 sec  No pull in neutral position  Joint mobs to proximal radius head - 5 reps no discomfort And gentle traction    ionto done- skin check done  prior  Ionto on lateral epicondyle - - medium patch - 2.0 current  19 min               OT Treatments/Exercises (OP) - 08/15/16 0001      Moist Heat Therapy   Number Minutes Moist Heat 10 Minutes   Moist Heat Location Elbow     Iontophoresis   Type of Iontophoresis Dexamethasone   Location lateral epicondyle - L    Dose med patch, at 2.o current    Time 19                OT Education - 08/15/16 0821    Education provided Yes   Education Details HEP review   Person(s) Educated Patient   Methods Explanation;Demonstration;Tactile cues;Verbal cues   Comprehension Verbal cues required;Returned demonstration;Verbalized understanding          OT Short Term Goals - 08/15/16 0819      OT SHORT TERM GOAL #1   Title Pain on PREE improve with 10 points at least    Baseline pain on PREE for eval 15/50 - now 4/50   Status  Achieved           OT Long Term Goals - 08/15/16 0819      OT LONG TERM GOAL #1   Title Pt to be ind in HEP for stretches, splint wearing and modifications to decrease pain    Status Achieved     OT LONG TERM GOAL #2   Title Pt report increase use on PREE by 5 points  without pain more than 1-2/10     Baseline PREE function score 9/50  and now 1/50   Status Achieved               Plan - 08/15/16 0920    Clinical Impression Statement Pt made great progress from [facility] - pain improve on PREE from 15 to 4/50 and fuction only 1/50 now - grip increase and no pain at night anymore - and episodes during day decrease greatly - pt feels comfortable to cont with HEP  to maintain progress    OT Treatment/Interventions Self-care/ADL training;Moist Heat;Iontophoresis;Ultrasound;Cryotherapy;Manual Therapy;Passive range of motion;Therapeutic exercises;Splinting;Patient/family education   Plan discharge with HEP   OT Home Exercise Plan see pt instructions   Consulted and Agree with Plan of Care Patient      Patient will benefit from skilled  therapeutic intervention in order to improve the following deficits and impairments:     Visit Diagnosis: Pain in left elbow - Plan: Ot plan of care cert/re-cert  Bilateral carpal tunnel syndrome - Plan: Ot plan of care cert/re-cert  Stiffness of left elbow, not elsewhere classified - Plan: Ot plan of care cert/re-cert    Problem List There are no active problems to display for this patient.   Oletta Cohn OTR/L,CLT 08/15/2016, 9:24 AM  [place] [facility] PHYSICAL AND SPORTS MEDICINE [address], [place], [address] Phone: [phone]   Fax:  [phone]  Name: Jeremy Castillo MRN: [id number] Date of Birth: Feb 10, 1966

## 2016-11-15 DIAGNOSIS — R350 Frequency of micturition: Secondary | ICD-10-CM | POA: Diagnosis not present

## 2016-11-15 DIAGNOSIS — R3914 Feeling of incomplete bladder emptying: Secondary | ICD-10-CM | POA: Diagnosis not present

## 2016-11-15 DIAGNOSIS — N401 Enlarged prostate with lower urinary tract symptoms: Secondary | ICD-10-CM | POA: Diagnosis not present

## 2017-04-24 DIAGNOSIS — N401 Enlarged prostate with lower urinary tract symptoms: Secondary | ICD-10-CM | POA: Diagnosis not present

## 2017-04-24 DIAGNOSIS — N138 Other obstructive and reflux uropathy: Secondary | ICD-10-CM | POA: Diagnosis not present

## 2017-04-24 DIAGNOSIS — Z0000 Encounter for general adult medical examination without abnormal findings: Secondary | ICD-10-CM | POA: Diagnosis not present

## 2017-11-11 DIAGNOSIS — R3914 Feeling of incomplete bladder emptying: Secondary | ICD-10-CM | POA: Diagnosis not present

## 2017-11-11 DIAGNOSIS — R350 Frequency of micturition: Secondary | ICD-10-CM | POA: Diagnosis not present

## 2017-11-11 DIAGNOSIS — N401 Enlarged prostate with lower urinary tract symptoms: Secondary | ICD-10-CM | POA: Diagnosis not present

## 2017-11-11 DIAGNOSIS — R351 Nocturia: Secondary | ICD-10-CM | POA: Diagnosis not present

## 2017-11-16 DIAGNOSIS — L247 Irritant contact dermatitis due to plants, except food: Secondary | ICD-10-CM | POA: Diagnosis not present

## 2018-05-09 ENCOUNTER — Emergency Department
Admission: EM | Admit: 2018-05-09 | Discharge: 2018-05-10 | Disposition: A | Discharge status: Home / Self Care | Payer: 59 | Attending: Emergency Medicine | Admitting: Emergency Medicine

## 2018-05-09 ENCOUNTER — Emergency Department: Payer: 59

## 2018-05-09 ENCOUNTER — Encounter: Payer: Self-pay | Admitting: Emergency Medicine

## 2018-05-09 ENCOUNTER — Other Ambulatory Visit: Payer: Self-pay

## 2018-05-09 DIAGNOSIS — Z79899 Other long term (current) drug therapy: Secondary | ICD-10-CM | POA: Insufficient documentation

## 2018-05-09 DIAGNOSIS — N132 Hydronephrosis with renal and ureteral calculous obstruction: Secondary | ICD-10-CM | POA: Diagnosis not present

## 2018-05-09 DIAGNOSIS — R319 Hematuria, unspecified: Secondary | ICD-10-CM | POA: Diagnosis not present

## 2018-05-09 DIAGNOSIS — R109 Unspecified abdominal pain: Secondary | ICD-10-CM | POA: Diagnosis not present

## 2018-05-09 DIAGNOSIS — N200 Calculus of kidney: Secondary | ICD-10-CM

## 2018-05-09 HISTORY — DX: Calculus of kidney: N20.0

## 2018-05-09 LAB — COMPREHENSIVE METABOLIC PANEL
ALBUMIN: 4.5 g/dL (ref 3.5–5.0)
ALT: 33 U/L (ref 0–44)
AST: 28 U/L (ref 15–41)
Alkaline Phosphatase: 74 U/L (ref 38–126)
Anion gap: 8 (ref 5–15)
BUN: 19 mg/dL (ref 6–20)
CALCIUM: 9.2 mg/dL (ref 8.9–10.3)
CO2: 26 mmol/L (ref 22–32)
CREATININE: 0.93 mg/dL (ref 0.61–1.24)
Chloride: 107 mmol/L (ref 98–111)
GFR calc Af Amer: >60 mL/min (ref >60)
GFR calc non Af Amer: >60 mL/min (ref >60)
GLUCOSE: 150 mg/dL — AB (ref 70–99)
Potassium: 4.1 mmol/L (ref 3.5–5.1)
Sodium: 141 mmol/L (ref 135–145)
TOTAL PROTEIN: 6.5 g/dL (ref 6.5–8.1)
Total Bilirubin: 0.6 mg/dL (ref 0.3–1.2)

## 2018-05-09 LAB — CBC
HCT: 46.8 % (ref 39.0–52.0)
Hemoglobin: 15.7 g/dL (ref 13.0–17.0)
MCH: 29.6 pg (ref 26.0–34.0)
MCHC: 33.5 g/dL (ref 30.0–36.0)
MCV: 88.1 fL (ref 80.0–100.0)
PLATELETS: 186 10*3/uL (ref 150–400)
RBC: 5.31 MIL/uL (ref 4.22–5.81)
RDW: 12.9 % (ref 11.5–15.5)
WBC: 7.2 10*3/uL (ref 4.0–10.5)
nRBC: 0 % (ref 0.0–0.2)

## 2018-05-09 LAB — URINALYSIS, COMPLETE (UACMP) WITH MICROSCOPIC
Bilirubin Urine: NEGATIVE
Glucose, UA: NEGATIVE mg/dL
Ketones, ur: NEGATIVE mg/dL
Leukocytes, UA: NEGATIVE
Nitrite: NEGATIVE
Protein, ur: 30 mg/dL — AB
RBC / HPF: >50 RBC/hpf — ABNORMAL HIGH (ref 0–5)
SPECIFIC GRAVITY, URINE: 1.029 (ref 1.005–1.030)
Squamous Epithelial / LPF: NONE SEEN (ref 0–5)
pH: 5 (ref 5.0–8.0)

## 2018-05-09 MED ORDER — OXYCODONE-ACETAMINOPHEN 5-325 MG PO TABS
1.0000 | ORAL_TABLET | ORAL | Status: DC | PRN
  Administered 2018-05-09: 1 via ORAL
  Filled 2018-05-09: qty 1

## 2018-05-09 MED ORDER — OXYCODONE-ACETAMINOPHEN 5-325 MG PO TABS: 1.0000 | ORAL_TABLET | ORAL | 0 refills | Status: DC | PRN

## 2018-05-09 MED ORDER — SODIUM CHLORIDE 0.9 % IV BOLUS
1000.0000 mL | Freq: Once | INTRAVENOUS | Status: AC
  Administered 2018-05-09: 1000 mL via INTRAVENOUS

## 2018-05-09 MED ORDER — KETOROLAC TROMETHAMINE 30 MG/ML IJ SOLN
30.0000 mg | Freq: Once | INTRAMUSCULAR | Status: AC
  Administered 2018-05-09: 30 mg via INTRAVENOUS
  Filled 2018-05-09: qty 1

## 2018-05-09 NOTE — Discharge Instructions (Addendum)
Follow-up with Dr. Sheppard Penton.  Please take copy of your CT results to him.  Return to emergency department if worsening.  Take the pain medication if needed.

## 2018-05-09 NOTE — ED Provider Notes (Signed)
[facility] Emergency Department Provider Note  ____________________________________________   First MD Initiated Contact with Patient 05/09/18 2133     (approximate)  I have reviewed the triage vital signs and the nursing notes.   HISTORY  Chief Complaint Nephrolithiasis    HPI Jeremy Castillo is a 52 y.o. male to the emergency department complaining of right-sided flank pain that started tonight.  He has a history of kidney stones.  He states this would be #6 or 7.  He has had lithotripsy previously.  He states the pain has decreased some since they gave him the Percocet in triage.  He denies any fever or chills.  Denies vomiting or diarrhea.    Past Medical History:  Diagnosis Date  . Kidney stones     There are no active problems to display for this patient.   Past Surgical History:  Procedure Laterality Date  . LITHOTRIPSY      Prior to Admission medications   Medication Sig Start Date End Date Taking? Authorizing Provider  oxyCODONE-acetaminophen (PERCOCET/ROXICET) 5-325 MG tablet Take 1 tablet by mouth every 4 (four) hours as needed for severe pain. 05/09/18   Audy Dauphine, Linden Dolin, PA-C  tamsulosin (FLOMAX) 0.4 MG CAPS capsule Take 0.4 mg by mouth.    [provider]    Allergies Patient has no known allergies.  No family history on file.  Social History Social History   Tobacco Use  . Smoking status: Never Smoker  . Smokeless tobacco: Never Used  Substance Use Topics  . Alcohol use: Not on file  . Drug use: Not on file    Review of Systems  Constitutional: No fever/chills Eyes: No visual changes. ENT: No sore throat. Respiratory: Denies cough Gastrointestinal: Positive right-sided flank pain Genitourinary: Negative for dysuria. Musculoskeletal: Negative for back pain. Skin: Negative for rash.    ____________________________________________   PHYSICAL EXAM:  VITAL SIGNS: ED Triage Vitals  Enc Vitals  Group     BP 05/09/18 1959 (!) 185/101     Pulse Rate 05/09/18 1959 81     Resp 05/09/18 1959 18     Temp 05/09/18 1959 98.2 F (36.8 C)     Temp Source 05/09/18 1959 Oral     SpO2 05/09/18 1959 99 %     Weight 05/09/18 2000 170 lb (77.1 kg)     Height 05/09/18 2000 '5\' 6"'  (1.676 m)     Head Circumference --      Peak Flow --      Pain Score 05/09/18 1959 5     Pain Loc --      Pain Edu? --      Excl. in [place]? --     Constitutional: Alert and oriented. Well appearing and in no acute distress. Eyes: Conjunctivae are normal.  Head: Atraumatic. Nose: No congestion/rhinnorhea. Mouth/Throat: Mucous membranes are moist.   Neck:  supple no lymphadenopathy noted Cardiovascular: Normal rate, regular rhythm. Heart sounds are normal Respiratory: Normal respiratory effort.  No retractions, lungs c t a  Abd: soft nontender bs normal all 4 quad, no CVA tenderness is noted GU: deferred Musculoskeletal: FROM all extremities, warm and well perfused Neurologic:  Normal speech and language.  Skin:  Skin is warm, dry and intact. No rash noted. Psychiatric: Mood and affect are normal. Speech and behavior are normal.  ____________________________________________   LABS (all labs ordered are listed, but only abnormal results are displayed)  Labs Reviewed  COMPREHENSIVE METABOLIC PANEL - Abnormal; Notable for  the following components:      Result Value   Glucose, Bld 150 (*)    All other components within normal limits  URINALYSIS, COMPLETE (UACMP) WITH MICROSCOPIC - Abnormal; Notable for the following components:   Color, Urine YELLOW (*)    APPearance CLOUDY (*)    Hgb urine dipstick LARGE (*)    Protein, ur 30 (*)    RBC / HPF >50 (*)    Bacteria, UA RARE (*)    All other components within normal limits  CBC   ____________________________________________   ____________________________________________  RADIOLOGY  CT renal stone study positive for multiple calculi in the right  kidney.  Positive right hydronephrosis of both kidneys.  There is debris/hemorrhage noted in the bladder.  This may be from the recent passing of stone that the patient has a napkin in his room.  ____________________________________________   PROCEDURES  Procedure(s) performed: Saline lock, Toradol 30 mg IV, saline 1 L IV    Procedures    ____________________________________________   INITIAL IMPRESSION / ASSESSMENT AND PLAN / ED COURSE  Pertinent labs & imaging results that were available during my care of the patient were reviewed by me and considered in my medical decision making (see chart for details).   Patient is a 52 year old male presents emergency department complaining of right-sided flank pain.  He has a history of kidney stones.  On physical exam patient appears comfortable as he was given Percocet in triage.  No CVA tenderness or abdominal tenderness noted.  Remainder exam is unremarkable.  UA shows a large amount of hemoglobin with greater than 50 red blood cells.  CBC is normal.  Glucose is 150 the remainder comprehensive metabolic panel is normal.  X-ray for KUB shows a 4 mm stone in the shadow of the right kidney   CT renal stone study ordered  ----------------------------------------- 11:53 PM on 05/09/2018 -----------------------------------------  CT renal study shows multiple renal calculi in the right kidney.  CT results were discussed with the patient and his wife.  He was given prescription for Percocet 5/325 #15 no refill.  He is to follow-up with his urologist concerning all findings on the CT report.  He is to drink plenty of water to flush the remaining debris out of his bladder.  He states he understands will comply.  Was discharged in stable condition in the care of his wife.  As part of my medical decision making, I reviewed the following data within the [facility] notes reviewed and incorporated, Labs reviewed CBC, met  B are normal, UA positive for large amount hemoglobin and greater than 50 red blood cells old chart reviewed, Radiograph reviewed x-ray/KUB shows a 4 mm stone, CT renal stone shows multiple renal calculi in the right kidney, Notes from prior ED visits and [place] Controlled Substance Database  ____________________________________________   FINAL CLINICAL IMPRESSION(S) / ED DIAGNOSES  Final diagnoses:  Kidney stone      NEW MEDICATIONS STARTED DURING THIS VISIT:  New Prescriptions   OXYCODONE-ACETAMINOPHEN (PERCOCET/ROXICET) 5-325 MG TABLET    Take 1 tablet by mouth every 4 (four) hours as needed for severe pain.     Note:  This document was prepared using Dragon voice recognition software and may include unintentional dictation errors.    Versie Starks, PA-C 05/09/18 [place], [place], MD 05/10/18 [phone]

## 2018-05-09 NOTE — ED Triage Notes (Signed)
Patient with complaint of right flank pain that started about 18:00 tonight. Denies nausea, vomiting. Patient states that he has a history of kidney stones and that this pain feels the same.

## 2018-05-28 DIAGNOSIS — N401 Enlarged prostate with lower urinary tract symptoms: Secondary | ICD-10-CM | POA: Diagnosis not present

## 2018-05-28 DIAGNOSIS — N200 Calculus of kidney: Secondary | ICD-10-CM | POA: Diagnosis not present

## 2018-06-09 DIAGNOSIS — N401 Enlarged prostate with lower urinary tract symptoms: Secondary | ICD-10-CM | POA: Diagnosis not present

## 2018-06-09 DIAGNOSIS — N200 Calculus of kidney: Secondary | ICD-10-CM | POA: Diagnosis not present

## 2018-06-24 DIAGNOSIS — N138 Other obstructive and reflux uropathy: Secondary | ICD-10-CM | POA: Diagnosis not present

## 2018-06-24 DIAGNOSIS — N401 Enlarged prostate with lower urinary tract symptoms: Secondary | ICD-10-CM | POA: Diagnosis not present

## 2018-06-24 DIAGNOSIS — Z0000 Encounter for general adult medical examination without abnormal findings: Secondary | ICD-10-CM | POA: Diagnosis not present

## 2018-07-15 HISTORY — PX: COLONOSCOPY: SHX5424

## 2018-11-11 DIAGNOSIS — N200 Calculus of kidney: Secondary | ICD-10-CM | POA: Diagnosis not present

## 2018-11-11 DIAGNOSIS — N401 Enlarged prostate with lower urinary tract symptoms: Secondary | ICD-10-CM | POA: Diagnosis not present

## 2018-11-11 DIAGNOSIS — Z125 Encounter for screening for malignant neoplasm of prostate: Secondary | ICD-10-CM | POA: Diagnosis not present

## 2018-11-11 DIAGNOSIS — R351 Nocturia: Secondary | ICD-10-CM | POA: Diagnosis not present

## 2018-11-17 DIAGNOSIS — N401 Enlarged prostate with lower urinary tract symptoms: Secondary | ICD-10-CM | POA: Diagnosis not present

## 2018-11-19 DIAGNOSIS — N401 Enlarged prostate with lower urinary tract symptoms: Secondary | ICD-10-CM | POA: Diagnosis not present

## 2018-11-25 DIAGNOSIS — N401 Enlarged prostate with lower urinary tract symptoms: Secondary | ICD-10-CM | POA: Diagnosis not present

## 2018-11-25 DIAGNOSIS — R3914 Feeling of incomplete bladder emptying: Secondary | ICD-10-CM | POA: Diagnosis not present

## 2018-12-14 HISTORY — PX: CYSTOSCOPY WITH INSERTION OF UROLIFT: SHX6678

## 2020-01-11 ENCOUNTER — Other Ambulatory Visit: Payer: Self-pay | Admitting: Urology

## 2020-01-11 ENCOUNTER — Other Ambulatory Visit (HOSPITAL_COMMUNITY): Payer: Self-pay | Admitting: Urology

## 2020-01-11 DIAGNOSIS — R310 Gross hematuria: Secondary | ICD-10-CM

## 2020-01-24 ENCOUNTER — Other Ambulatory Visit: Payer: Self-pay

## 2020-01-24 ENCOUNTER — Ambulatory Visit
Admission: RE | Admit: 2020-01-24 | Discharge: 2020-01-24 | Disposition: A | Discharge status: Home / Self Care | Payer: 59 | Source: Ambulatory Visit | Attending: Urology | Admitting: Urology

## 2020-01-24 DIAGNOSIS — R310 Gross hematuria: Secondary | ICD-10-CM | POA: Diagnosis not present

## 2020-01-24 MED ORDER — IOHEXOL 300 MG/ML  SOLN
125.0000 mL | Freq: Once | INTRAMUSCULAR | Status: AC | PRN
  Administered 2020-01-24: 125 mL via INTRAVENOUS

## 2020-02-01 ENCOUNTER — Other Ambulatory Visit: Payer: Self-pay

## 2020-02-01 ENCOUNTER — Encounter
Admission: RE | Admit: 2020-02-01 | Discharge: 2020-02-01 | Disposition: A | Discharge status: Home / Self Care | Payer: 59 | Source: Ambulatory Visit | Attending: Urology | Admitting: Urology

## 2020-02-01 ENCOUNTER — Other Ambulatory Visit
Admission: RE | Admit: 2020-02-01 | Discharge: 2020-02-01 | Disposition: A | Discharge status: Home / Self Care | Payer: 59 | Source: Ambulatory Visit | Attending: Urology | Admitting: Urology

## 2020-02-01 DIAGNOSIS — Z01812 Encounter for preprocedural laboratory examination: Secondary | ICD-10-CM | POA: Diagnosis present

## 2020-02-01 DIAGNOSIS — Z20822 Contact with and (suspected) exposure to covid-19: Secondary | ICD-10-CM | POA: Insufficient documentation

## 2020-02-01 HISTORY — DX: Personal history of urinary calculi: Z87.442

## 2020-02-01 HISTORY — DX: Benign prostatic hyperplasia without lower urinary tract symptoms: N40.0

## 2020-02-01 LAB — SARS CORONAVIRUS 2 (TAT 6-24 HRS): SARS Coronavirus 2: NEGATIVE

## 2020-02-01 NOTE — Patient Instructions (Signed)
Your procedure is scheduled on: THURSDAY February 03, 2020 Report to Day Surgery on the 2nd floor of the [facility]. To find out your arrival time, please call [phone] between 1PM - 3PM on: Wednesday February 02, 2020   REMEMBER: Instructions that are not followed completely may result in serious medical risk, up to and including death; or upon the discretion of your surgeon and anesthesiologist your surgery may need to be rescheduled.  Do not eat food after midnight the night before surgery.  No gum chewing, lozengers or hard candies.  You may however, drink CLEAR liquids up to 2 hours before you are scheduled to arrive for your surgery. Do not drink anything within 2 hours of your scheduled arrival time.  Clear liquids include: - water  - apple juice without pulp - gatorade (not RED) - black coffee or tea (Do NOT add milk or creamers to the coffee or tea) Do NOT drink anything that is not on this list.  Type 1 and Type 2 diabetics should only drink water.   TAKE THESE MEDICATIONS THE MORNING OF SURGERY WITH A SIP OF WATER: NONE  Stop Anti-inflammatories (NSAIDS) such as Advil, Aleve, Ibuprofen, Motrin, Naproxen, Naprosyn and  ASPIRIN AND Aspirin based products such as Excedrin, Goodys Powder, BC Powder. (May take Tylenol or Acetaminophen if needed.)  Stop ANY OVER THE COUNTER supplements until after surgery. (May continue Vitamin D, Vitamin B, and multivitamin.)  No Alcohol for 24 hours before or after surgery.  No Smoking including e-cigarettes for 24 hours prior to surgery.  No chewable tobacco products for at least 6 hours prior to surgery.  No nicotine patches on the day of surgery.  Do not use any "recreational" drugs for at least a week prior to your surgery.  Please be advised that the combination of cocaine and anesthesia may have negative outcomes, up to and including death. If you test positive for cocaine, your surgery will be cancelled.  On the morning of  surgery brush your teeth with toothpaste and water, you may rinse your mouth with mouthwash if you wish. Do not swallow any toothpaste or mouthwash.  Do not wear jewelry, make-up, hairpins, clips or nail polish.  Do not wear lotions, powders, or perfumes. OR AFTERSHAVE  Do not shave 48 hours prior to surgery.   Contact lenses, hearing aids and dentures may not be worn into surgery.  Do not bring valuables to the hospital. [facility] is not responsible for any missing/lost belongings or valuables.   SHOWER DAY OF SURGERY  Notify your doctor if there is any change in your medical condition (cold, fever, infection).  Wear comfortable clothing (specific to your surgery type) to the hospital.  Plan for stool softeners for home use; pain medications have a tendency to cause constipation. You can also help prevent constipation by eating foods high in fiber such as fruits and vegetables and drinking plenty of fluids as your diet allows.  After surgery, you can help prevent lung complications by doing breathing exercises.  Take deep breaths and cough every 1-2 hours. Your doctor may order a device called an Incentive Spirometer to help you take deep breaths. When coughing or sneezing, hold a pillow firmly against your incision with both hands. This is called "splinting." Doing this helps protect your incision. It also decreases belly discomfort.  If you are being discharged the day of surgery, you will not be allowed to drive home. You will need a responsible adult (18  years or older) to drive you home and stay with you that night.   If you are taking public transportation, you will need to have a responsible adult (18 years or older) with you. Please confirm with your physician that it is acceptable to use public transportation.   Please call the Pre-admissions Testing Dept. at [phone] if you have any questions about these instructions.  Visitation Policy:  Patients undergoing a  surgery or procedure may have one family member or support person with them as long as that person is not COVID-19 positive or experiencing its symptoms.  That person may remain in the waiting area during the procedure.  Children under 12 years of age may have both parents or legal guardians with them during their procedure.  Inpatient Visitation Update:   Two designated support people may visit a patient during visiting hours 7 am to 8 pm. It must be the same two designated people for the duration of the patient stay. The visitors may come and go during the day, and there is no switching out to have different visitors. A mask must be worn at all times, including in the patient room.  Children under 37 years of age:  a total of 4 designated visitors for the child's entire stay are allowed. Only 2 in the room at a time and only one staying overnight at a time. The overnight guest can now rotate during the child's hospital stay.  As a reminder, masks are still required for all [facility] team members, patients and visitors in all [facility] facilities.   Systemwide, no visitors 17 or younger.

## 2020-02-02 NOTE — H&P (Signed)
NAME: Jeremy Castillo, Jeremy Castillo MEDICAL RECORD [id number] ACCOUNT [id number] DATE OF BIRTH:04-19-1966 FACILITY: [facility] LOCATION: [facility] PHYSICIAN:Arra Connaughton Gilles Chiquito, MD  HISTORY AND PHYSICAL  DATE OF ADMISSION:  02/03/2020  CHIEF COMPLAINT:  Hematuria and dysuria.  HISTORY OF PRESENT ILLNESS:  The patient is a 54 year old white male who presented to the office 12/2007 with 3 episodes of intermittent hematuria over the prior month.  He also has had a slight degree of intermittent dysuria.  Evaluation in the office  included urine culture, which was negative, CT scan which indicated a small right renal calculi and cystoscopy, which indicated the presence of a 4-5 mm bladder stone.  He comes in now for cystoscopy with litholapaxy of the bladder stone.  ALLERGIES:  No drug allergies.  CURRENT MEDICATIONS:  Include multivitamins.  PAST SURGICAL HISTORY:  UroLift procedure 12/2018.  PAST AND CURRENT MEDICAL CONDITIONS:  Negative.   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  SOCIAL HISTORY:  The patient denied tobacco use.  Consumes 2 alcoholic beverages per week.  FAMILY HISTORY:  Father is living, age 8 with history of colon cancer.  Mother is living, age 4 without any health problems.  PHYSICAL EXAMINATION: VITAL SIGNS:  Height was 5 feet 6 inches, weight 169 pounds, BMI 29. GENERAL:  Well-nourished, white male in no acute distress. HEENT:  Sclerae were clear.  Pupils are equally round, reactive to light and accommodation. NECK:  No palpable cervical adenopathy.  No audible carotid bruits. PULMONARY:  Lungs clear to auscultation. CARDIOVASCULAR:  Regular rhythm and rate without audible murmurs. ABDOMEN:  Soft, nontender abdomen. GENITOURINARY AND RECTAL:  Deferred. NEUROMUSCULAR:  Alert and oriented x3.  IMPRESSION:  Gross hematuria due to bladder stone and possibly right renal stones.  PLAN:  Cystoscopy with  litholapaxy using the holmium laser.  PN/NUANCE  D:01/31/2020 T:01/31/2020 JOB:[id number]

## 2020-02-03 ENCOUNTER — Encounter: Payer: Self-pay | Admitting: Urology

## 2020-02-03 ENCOUNTER — Ambulatory Visit
Admission: RE | Admit: 2020-02-03 | Discharge: 2020-02-03 | Disposition: A | Discharge status: Home / Self Care | Payer: 59 | Attending: Urology | Admitting: Urology

## 2020-02-03 ENCOUNTER — Other Ambulatory Visit: Payer: Self-pay

## 2020-02-03 ENCOUNTER — Ambulatory Visit: Payer: 59 | Admitting: Certified Registered"

## 2020-02-03 ENCOUNTER — Encounter
Admission: RE | Disposition: A | Discharge status: Home / Self Care | Payer: Self-pay | Source: Home / Self Care | Attending: Urology

## 2020-02-03 DIAGNOSIS — N210 Calculus in bladder: Secondary | ICD-10-CM | POA: Insufficient documentation

## 2020-02-03 DIAGNOSIS — Z466 Encounter for fitting and adjustment of urinary device: Secondary | ICD-10-CM | POA: Diagnosis not present

## 2020-02-03 DIAGNOSIS — Z87442 Personal history of urinary calculi: Secondary | ICD-10-CM | POA: Insufficient documentation

## 2020-02-03 HISTORY — PX: CYSTOSCOPY WITH LITHOLAPAXY: SHX1425

## 2020-02-03 SURGERY — CYSTOSCOPY, WITH BLADDER CALCULUS LITHOLAPAXY
Anesthesia: General | Site: Bladder

## 2020-02-03 MED ORDER — FENTANYL CITRATE (PF) 100 MCG/2ML IJ SOLN
INTRAMUSCULAR | Status: AC
  Filled 2020-02-03: qty 2

## 2020-02-03 MED ORDER — CHLORHEXIDINE GLUCONATE 0.12 % MT SOLN
OROMUCOSAL | Status: AC
  Administered 2020-02-03: 15 mL via OROMUCOSAL
  Filled 2020-02-03: qty 15

## 2020-02-03 MED ORDER — ONDANSETRON HCL 4 MG/2ML IJ SOLN
INTRAMUSCULAR | Status: AC
  Filled 2020-02-03: qty 2

## 2020-02-03 MED ORDER — CHLORHEXIDINE GLUCONATE 0.12 % MT SOLN: 15.0000 mL | Freq: Once | OROMUCOSAL | Status: AC

## 2020-02-03 MED ORDER — LIDOCAINE HCL URETHRAL/MUCOSAL 2 % EX GEL
CUTANEOUS | Status: AC
  Filled 2020-02-03: qty 10

## 2020-02-03 MED ORDER — ACETAMINOPHEN 10 MG/ML IV SOLN
INTRAVENOUS | Status: AC
  Filled 2020-02-03: qty 100

## 2020-02-03 MED ORDER — LACTATED RINGERS IV SOLN: INTRAVENOUS | Status: DC

## 2020-02-03 MED ORDER — LIDOCAINE HCL URETHRAL/MUCOSAL 2 % EX GEL
CUTANEOUS | Status: DC | PRN
  Administered 2020-02-03: 1 via URETHRAL

## 2020-02-03 MED ORDER — CEFAZOLIN SODIUM-DEXTROSE 1-4 GM/50ML-% IV SOLN
1.0000 g | Freq: Once | INTRAVENOUS | Status: AC
  Administered 2020-02-03: 1 g via INTRAVENOUS

## 2020-02-03 MED ORDER — FAMOTIDINE 20 MG PO TABS: 20.0000 mg | ORAL_TABLET | Freq: Once | ORAL | Status: AC

## 2020-02-03 MED ORDER — BELLADONNA ALKALOIDS-OPIUM 16.2-60 MG RE SUPP
RECTAL | Status: AC
  Filled 2020-02-03: qty 1

## 2020-02-03 MED ORDER — CIPROFLOXACIN HCL 500 MG PO TABS
500.0000 mg | ORAL_TABLET | Freq: Two times a day (BID) | ORAL | 0 refills | Status: DC
Start: 2020-02-03 — End: 2021-03-01

## 2020-02-03 MED ORDER — PROPOFOL 10 MG/ML IV BOLUS
INTRAVENOUS | Status: AC
  Filled 2020-02-03: qty 40

## 2020-02-03 MED ORDER — PROMETHAZINE HCL 25 MG/ML IJ SOLN: 6.2500 mg | INTRAMUSCULAR | Status: DC | PRN

## 2020-02-03 MED ORDER — FAMOTIDINE 20 MG PO TABS
ORAL_TABLET | ORAL | Status: AC
  Administered 2020-02-03: 20 mg via ORAL
  Filled 2020-02-03: qty 1

## 2020-02-03 MED ORDER — ONDANSETRON HCL 4 MG/2ML IJ SOLN
INTRAMUSCULAR | Status: DC | PRN
  Administered 2020-02-03: 4 mg via INTRAVENOUS

## 2020-02-03 MED ORDER — LIDOCAINE HCL (PF) 2 % IJ SOLN
INTRAMUSCULAR | Status: AC
  Filled 2020-02-03: qty 5

## 2020-02-03 MED ORDER — BELLADONNA ALKALOIDS-OPIUM 16.2-60 MG RE SUPP
RECTAL | Status: DC | PRN
  Administered 2020-02-03: 1 via RECTAL

## 2020-02-03 MED ORDER — PROPOFOL 10 MG/ML IV BOLUS
INTRAVENOUS | Status: DC | PRN
  Administered 2020-02-03: 200 mg via INTRAVENOUS

## 2020-02-03 MED ORDER — FENTANYL CITRATE (PF) 100 MCG/2ML IJ SOLN
25.0000 ug | INTRAMUSCULAR | Status: DC | PRN
  Administered 2020-02-03: 50 ug via INTRAVENOUS

## 2020-02-03 MED ORDER — MIDAZOLAM HCL 2 MG/2ML IJ SOLN
INTRAMUSCULAR | Status: AC
  Filled 2020-02-03: qty 2

## 2020-02-03 MED ORDER — ORAL CARE MOUTH RINSE: 15.0000 mL | Freq: Once | OROMUCOSAL | Status: AC

## 2020-02-03 MED ORDER — DEXAMETHASONE SODIUM PHOSPHATE 10 MG/ML IJ SOLN
INTRAMUSCULAR | Status: DC | PRN
  Administered 2020-02-03: 5 mg via INTRAVENOUS

## 2020-02-03 MED ORDER — CEFAZOLIN SODIUM-DEXTROSE 1-4 GM/50ML-% IV SOLN
INTRAVENOUS | Status: AC
  Filled 2020-02-03: qty 50

## 2020-02-03 MED ORDER — URIBEL 118 MG PO CAPS: 1.0000 | ORAL_CAPSULE | Freq: Four times a day (QID) | ORAL | 3 refills | Status: DC | PRN

## 2020-02-03 MED ORDER — LIDOCAINE HCL (CARDIAC) PF 100 MG/5ML IV SOSY
PREFILLED_SYRINGE | INTRAVENOUS | Status: DC | PRN
  Administered 2020-02-03: 80 mg via INTRAVENOUS

## 2020-02-03 MED ORDER — FENTANYL CITRATE (PF) 100 MCG/2ML IJ SOLN
INTRAMUSCULAR | Status: DC | PRN
  Administered 2020-02-03 (×4): 50 ug via INTRAVENOUS

## 2020-02-03 MED ORDER — MIDAZOLAM HCL 2 MG/2ML IJ SOLN
INTRAMUSCULAR | Status: DC | PRN
  Administered 2020-02-03: 2 mg via INTRAVENOUS

## 2020-02-03 MED ORDER — ACETAMINOPHEN 10 MG/ML IV SOLN
INTRAVENOUS | Status: DC | PRN
  Administered 2020-02-03: 1000 mg via INTRAVENOUS

## 2020-02-03 MED ORDER — DEXAMETHASONE SODIUM PHOSPHATE 10 MG/ML IJ SOLN
INTRAMUSCULAR | Status: AC
  Filled 2020-02-03: qty 1

## 2020-02-03 MED ORDER — FENTANYL CITRATE (PF) 100 MCG/2ML IJ SOLN
INTRAMUSCULAR | Status: AC
  Administered 2020-02-03: 50 ug via INTRAVENOUS
  Filled 2020-02-03: qty 2

## 2020-02-03 SURGICAL SUPPLY — 17 items
BAG DRAIN CYSTO-URO LG1000N (MISCELLANEOUS) ×2 IMPLANT
CNTNR SPEC 2.5X3XGRAD LEK (MISCELLANEOUS) ×1
CONT SPEC 4OZ STER OR WHT (MISCELLANEOUS) ×1
CONT SPEC 4OZ STRL OR WHT (MISCELLANEOUS) ×1
CONTAINER SPEC 2.5X3XGRAD LEK (MISCELLANEOUS) ×1 IMPLANT
FIBER LASER FLEXIVA 1000 (UROLOGICAL SUPPLIES) ×1 IMPLANT
GLOVE BIO SURGEON STRL SZ7.5 (GLOVE) ×2 IMPLANT
GOWN STRL REUS W/ TWL LRG LVL4 (GOWN DISPOSABLE) ×1 IMPLANT
GOWN STRL REUS W/ TWL XL LVL3 (GOWN DISPOSABLE) ×1 IMPLANT
GOWN STRL REUS W/TWL LRG LVL4 (GOWN DISPOSABLE) ×2
GOWN STRL REUS W/TWL XL LVL3 (GOWN DISPOSABLE) ×2
KIT TURNOVER CYSTO (KITS) ×2 IMPLANT
PACK CYSTO AR (MISCELLANEOUS) ×2 IMPLANT
SET IRRIG Y TYPE TUR BLADDER L (SET/KITS/TRAYS/PACK) ×2 IMPLANT
SYRINGE IRR TOOMEY STRL 70CC (SYRINGE) ×2 IMPLANT
WATER STERILE IRR 1000ML POUR (IV SOLUTION) ×2 IMPLANT
WATER STERILE IRR 3000ML UROMA (IV SOLUTION) ×2 IMPLANT

## 2020-02-03 NOTE — H&P (Signed)
Date of Initial H&P: 02/02/20  History reviewed, patient examined, no change in status, stable for surgery.

## 2020-02-03 NOTE — Discharge Instructions (Signed)

## 2020-02-03 NOTE — Op Note (Addendum)
Preoperative diagnosis: Bladder stone (N21.0)  Postoperative diagnosis: 1.  Bladder stone                                              2.  Bladder neck UroLift (T19.1XXA)  Procedure: 1.  Cystoscopy with litholapaxy of bladder stone (CPT 52310)                      2.  Removal of UroLift at left bladder neck  Surgeon: Suszanne Conners. Evelene Croon MD  Anesthesia: General  Indications:See the history and physical. After informed consent the above procedure(s) were requested     Technique and findings: After adequate general anesthesia been obtain the patient was placed into dorsal lithotomy position and the perineum was prepped and draped in the usual fashion.  21 French cystoscope was coupled to the camera and visually advanced into the bladder.  The bladder was thoroughly inspected and no bladder tumors were identified.  Both ureteral orifices were identified and had clear efflux.  The prostatic fossa was widely patent.  A 6 mm stone was attached to the left bladder neck at approximately the 2 o'clock position.  1000 m holmium laser fiber was introduced through the scope and set at a power level 0.5 and a frequency of 10 Hz.  The bladder stone was then disintegrated.  Inspection with the scope indicated that the stone had been attached to a UroLift implant.  The laser was then used to cut the UroLift suture and the UroLift implant was removed with an alligator forceps.  The bladder was drained and cystoscope was removed.  10 cc of viscous Xylocaine was instilled within the urethra and the bladder.  A B&O suppository was placed.  Procedure was then terminated and patient transferred to the recovery room in stable condition.

## 2020-02-03 NOTE — Transfer of Care (Signed)
Immediate Anesthesia Transfer of Care Note  Patient: Jeremy Castillo  Procedure(s) Performed: CYSTOSCOPY WITH LITHOLAPAXY W/HOLMIUM LASER (N/A Bladder)  Patient Location: PACU  Anesthesia Type:General  Level of Consciousness: awake, alert  and oriented  Airway & Oxygen Therapy: Patient Spontanous Breathing and Patient connected to face mask oxygen  Post-op Assessment: Report given to RN and Post -op Vital signs reviewed and stable  Post vital signs: Reviewed and stable  Last Vitals:  Vitals Value Taken Time  BP    Temp    Pulse    Resp    SpO2      Last Pain:  Vitals:   02/03/20 0626  TempSrc: Oral  PainSc: 0-No pain         Complications: No complications documented.

## 2020-02-03 NOTE — Anesthesia Procedure Notes (Signed)
Procedure Name: LMA Insertion Date/Time: 02/03/2020 7:38 AM Performed by: Clyde Lundborg, CRNA Pre-anesthesia Checklist: Patient identified, Emergency Drugs available, Suction available and Patient being monitored Patient Re-evaluated:Patient Re-evaluated prior to induction Oxygen Delivery Method: Circle system utilized Preoxygenation: Pre-oxygenation with 100% oxygen Induction Type: IV induction Ventilation: Mask ventilation without difficulty LMA: LMA inserted LMA Size: 4.0 Tube type: Oral Number of attempts: 1 Placement Confirmation: positive ETCO2,  breath sounds checked- equal and bilateral and CO2 detector Tube secured with: Tape Dental Injury: Teeth and Oropharynx as per pre-operative assessment

## 2020-02-03 NOTE — Anesthesia Preprocedure Evaluation (Signed)
Anesthesia Evaluation  Patient identified by MRN, date of birth, ID band Patient awake    Reviewed: Allergy & Precautions, H&P , NPO status , Patient's Chart, lab work & pertinent test results, reviewed documented beta blocker date and time   History of Anesthesia Complications Negative for: history of anesthetic complications  Airway Mallampati: II  TM Distance: >3 FB Neck ROM: full    Dental  (+) Dental Advidsory Given, Caps, Teeth Intact   Pulmonary neg pulmonary ROS,    Pulmonary exam normal breath sounds clear to auscultation       Cardiovascular Exercise Tolerance: Good negative cardio ROS Normal cardiovascular exam Rhythm:regular Rate:Normal     Neuro/Psych negative neurological ROS  negative psych ROS   GI/Hepatic negative GI ROS, Neg liver ROS,   Endo/Other  negative endocrine ROS  Renal/GU Renal disease (kidney stones)  negative genitourinary   Musculoskeletal   Abdominal   Peds  Hematology negative hematology ROS (+)   Anesthesia Other Findings Past Medical History: No date: Enlarged prostate No date: History of kidney stones No date: Kidney stones   Reproductive/Obstetrics negative OB ROS                             Anesthesia Physical Anesthesia Plan  ASA: I  Anesthesia Plan: General   Post-op Pain Management:    Induction: Intravenous  PONV Risk Score and Plan: 2 and Ondansetron, Dexamethasone, Midazolam and Treatment may vary due to age or medical condition  Airway Management Planned: Oral ETT and LMA  Additional Equipment:   Intra-op Plan:   Post-operative Plan: Extubation in OR  Informed Consent: I have reviewed the patients History and Physical, chart, labs and discussed the procedure including the risks, benefits and alternatives for the proposed anesthesia with the patient or authorized representative who has indicated his/her understanding and  acceptance.     Dental Advisory Given  Plan Discussed with: Anesthesiologist, CRNA and Surgeon  Anesthesia Plan Comments:         Anesthesia Quick Evaluation

## 2020-02-04 ENCOUNTER — Encounter: Payer: Self-pay | Admitting: Urology

## 2020-02-04 NOTE — Anesthesia Postprocedure Evaluation (Signed)
Anesthesia Post Note  Patient: Jeremy Castillo  Procedure(s) Performed: CYSTOSCOPY WITH LITHOLAPAXY W/HOLMIUM LASER (N/A Bladder)  Patient location during evaluation: PACU Anesthesia Type: General Level of consciousness: awake and alert Pain management: pain level controlled Vital Signs Assessment: post-procedure vital signs reviewed and stable Respiratory status: spontaneous breathing, nonlabored ventilation, respiratory function stable and patient connected to nasal cannula oxygen Cardiovascular status: blood pressure returned to baseline and stable Postop Assessment: no apparent nausea or vomiting Anesthetic complications: no   No complications documented.   Last Vitals:  Vitals:   02/03/20 1007 02/03/20 1021  BP: (!) 155/94 (!) 139/75  Pulse: 60 62  Resp: 12 18  Temp:  (!) 36.4 C  SpO2: 98% 98%    Last Pain:  Vitals:   02/03/20 1021  TempSrc: Temporal  PainSc: 0-No pain                 Lenard Simmer

## 2021-02-19 ENCOUNTER — Ambulatory Visit: Payer: 59 | Attending: Internal Medicine | Admitting: Occupational Therapy

## 2021-02-19 ENCOUNTER — Encounter: Payer: Self-pay | Admitting: Occupational Therapy

## 2021-02-19 DIAGNOSIS — G5601 Carpal tunnel syndrome, right upper limb: Secondary | ICD-10-CM | POA: Insufficient documentation

## 2021-02-19 DIAGNOSIS — M6281 Muscle weakness (generalized): Secondary | ICD-10-CM | POA: Diagnosis present

## 2021-02-19 DIAGNOSIS — M25631 Stiffness of right wrist, not elsewhere classified: Secondary | ICD-10-CM | POA: Insufficient documentation

## 2021-02-19 NOTE — Therapy (Signed)
[place] [facility]ICINE [address], [place], [address] Phone: [phone]   Fax:  [phone]  Occupational Therapy Evaluation  Patient Details  Name: Jeremy Castillo MRN: [id number] Date of Birth: 27-Jun-1966 Referring Provider (OT): DR Glo Herring Date: 02/19/2021   OT End of Session - 02/19/21 1935     Visit Number 1    Number of Visits 4    Date for OT Re-Evaluation 04/02/21    OT Start Time 0733    OT Stop Time 0820    OT Time Calculation (min) 47 min    Activity Tolerance Patient tolerated treatment well    Behavior During Therapy [facility] for tasks assessed/performed             Past Medical History:  Diagnosis Date   Enlarged prostate    History of kidney stones    Kidney stones     Past Surgical History:  Procedure Laterality Date   COLONOSCOPY     COLONOSCOPY  2020   CYSTOSCOPY WITH INSERTION OF UROLIFT  12/2018   CYSTOSCOPY WITH INSERTION OF UROLIFT     CYSTOSCOPY WITH LITHOLAPAXY N/A 02/03/2020   Procedure: CYSTOSCOPY WITH LITHOLAPAXY W/HOLMIUM LASER;  Surgeon: Orson Ape, MD;  Location: [facility] ORS;  Service: Urology;  Laterality: N/A;   LITHOTRIPSY      There were no vitals filed for this visit.   Subjective Assessment - 02/19/21 1928     Subjective  My wrist and hand pain and tightness started back the last 3-4 months - I tried to think what I did different - working from home the last 2 yrs, mowing lawn the same and playing guitar on Sunday's at church - but wrist feels just tender at times , tight and some numbness at night time in fingers    Pertinent History Pt was seen in 2016 for CTS and 2018 for R cubital tunnel - improved both times with hand therapy - pt refer by PCP for R wrist tightness, stiffness and some numbness that started about 3-4 months ago    Patient Stated Goals I want my wrists better so I don't need surgery    Currently in Pain? 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, R hand dominant - likes to camp , play guitar       AROM   Right Wrist Extension 64 Degrees    Right Wrist Flexion 95 Degrees    Left Wrist Extension 68 Degrees    Left Wrist Flexion 105 Degrees      Strength   Right Hand Grip (lbs) 76   extended 70  Right Hand Lateral Pinch 22 lbs    Right Hand 3 Point Pinch 21 lbs    Left Hand Grip (lbs) 85   extended 85   Left Hand Lateral Pinch 20 lbs    Left Hand 3 Point Pinch 23 lbs                      OT Treatments/Exercises (OP) - 02/19/21 0001       RUE Contrast Bath   Time 8 minutes    Comments R wrist and hand prior to soft tissue             review soft tissue mobs - wife to assist 1  x day  CT spreads ,and volar forearm and wrist done some graston tool nr 2 sweeping  Wife to assist to massage volar wrist and forearm Wrist extention stretch 2 xx day  After contrast  And then Med and ulnar N glide - 5 reps - 2 x day  Pain free  And enlarge objects and use larger joints pick up and carry   Cont with wrist splints at night time       OT Education - 02/19/21 1935     Education provided Yes    Education Details findings of eval and HEP    Person(s) Educated Patient    Methods Explanation;Verbal cues;Handout;Tactile cues    Comprehension Verbalized understanding;Returned demonstration;Verbal cues required    Person(s) Educated Patient     Methods Explanation;Demonstration;Verbal cues;Handout    Comprehension Verbalized understanding;Returned demonstration;Verbal cues required              OT Short Term Goals - 08/15/16 0819       OT SHORT TERM GOAL #1   Title Pain on PREE improve with 10 points at least     Baseline pain on PREE for eval 15/50 - now 4/50    Status Achieved               OT Long Term Goals - 02/19/21 1947       OT LONG TERM GOAL #1   Title Pt to be ind in HEP for stretches, splint wearing and modifications to decrease symptoms    Baseline very little knowledge, some numbness at night time , tightness volar wrist and tenderness    Time 4    Period Weeks    Target Date 03/19/21      OT LONG TERM GOAL #2   Title R wrist AROM and grip strength increase to WNL compare to L wrist ROM and grip strength 4 yrs ago    Baseline R grip strength decrease , and R wrist ext and flexion decrease compare to L    Time 6    Period Weeks    Status New    Target Date 04/02/21                   Plan - 02/19/21 1935     Clinical Impression Statement Pt was seen in 2016 and 2018 by OT for bilateral CTS and R cubital tunnel - pt showed improvement both times with OT. Pt this time refered by PCP with CTS on the R dominant wrist- tenderness , tightness and stiffness in R wrist and decrease strength in grip and 3 point pinch - show tenderness and some sensory changes at night time- pt was negative for Tinel and Phalen's -pt ed on HEP to use at home and pt can benefit from  OT services to decrease stiffness, tenderness and sensory changes    OT Occupational Profile and History Problem Focused Assessment - Including review of records relating to presenting problem    Occupational performance deficits (Please refer to evaluation for details): IADL's;Rest and Sleep;Work;Leisure    Body Structure / Function / Physical Skills ADL;Strength;Pain;UE functional use;Flexibility;IADL    Rehab Potential Fair     Clinical Decision Making Limited treatment options, no task modification necessary    Comorbidities Affecting Occupational Performance: None    Modification or Assistance to Complete Evaluation  No modification of tasks or assist necessary to complete eval    OT Frequency Biweekly    OT Duration 6 weeks    OT Treatment/Interventions Self-care/ADL training;Contrast Bath;Manual Therapy;Patient/family education;Splinting;DME and/or AE instruction    Consulted and Agree with Plan of Care Patient             Patient will benefit from skilled therapeutic intervention in order to improve the following deficits and impairments:   Body Structure / Function / Physical Skills: ADL, Strength, Pain, UE functional use, Flexibility, IADL       Visit Diagnosis: Carpal tunnel syndrome, right upper limb - Plan: Ot plan of care cert/re-cert  Stiffness of right wrist, not elsewhere classified - Plan: Ot plan of care cert/re-cert  Muscle weakness (generalized) - Plan: Ot plan of care cert/re-cert    Problem List There are no problems to display for this patient.   Oletta Cohn OTR/l,CLT 02/19/2021, 7:52 PM  [place] PH[facility]ICAL AND SPORTS MEDICINE [address] [place], [address] Phone: [phone]   Fax:  [phone]  Name: Jeremy Castillo MRN: [id number] Date of Birth: [birth year]

## 2021-03-01 ENCOUNTER — Other Ambulatory Visit: Payer: Self-pay

## 2021-03-01 ENCOUNTER — Encounter: Payer: Self-pay | Admitting: Physician Assistant

## 2021-03-01 ENCOUNTER — Emergency Department
Admission: EM | Admit: 2021-03-01 | Discharge: 2021-03-01 | Disposition: A | Discharge status: Home / Self Care | Payer: 59 | Attending: Emergency Medicine | Admitting: Emergency Medicine

## 2021-03-01 ENCOUNTER — Emergency Department: Payer: 59

## 2021-03-01 DIAGNOSIS — N1330 Unspecified hydronephrosis: Secondary | ICD-10-CM | POA: Insufficient documentation

## 2021-03-01 DIAGNOSIS — R109 Unspecified abdominal pain: Secondary | ICD-10-CM

## 2021-03-01 LAB — URINALYSIS, COMPLETE (UACMP) WITH MICROSCOPIC
Bacteria, UA: NONE SEEN
Bilirubin Urine: NEGATIVE
Glucose, UA: NEGATIVE mg/dL
Ketones, ur: 20 mg/dL — AB
Leukocytes,Ua: NEGATIVE
Nitrite: NEGATIVE
Protein, ur: 30 mg/dL — AB
Specific Gravity, Urine: 1.029 (ref 1.005–1.030)
Squamous Epithelial / HPF: NONE SEEN (ref 0–5)
pH: 5 (ref 5.0–8.0)

## 2021-03-01 MED ORDER — TAMSULOSIN HCL 0.4 MG PO CAPS
0.4000 mg | ORAL_CAPSULE | Freq: Once | ORAL | Status: AC
  Administered 2021-03-01: 0.4 mg via ORAL
  Filled 2021-03-01: qty 1

## 2021-03-01 MED ORDER — TAMSULOSIN HCL 0.4 MG PO CAPS: 0.4000 mg | ORAL_CAPSULE | Freq: Every day | ORAL | 0 refills | Status: AC

## 2021-03-01 MED ORDER — KETOROLAC TROMETHAMINE 30 MG/ML IJ SOLN
30.0000 mg | Freq: Once | INTRAMUSCULAR | Status: AC
  Administered 2021-03-01: 30 mg via INTRAVENOUS
  Filled 2021-03-01: qty 1

## 2021-03-01 MED ORDER — SODIUM CHLORIDE 0.9 % IV BOLUS
1000.0000 mL | Freq: Once | INTRAVENOUS | Status: AC
  Administered 2021-03-01: 1000 mL via INTRAVENOUS

## 2021-03-01 MED ORDER — OXYCODONE-ACETAMINOPHEN 5-325 MG PO TABS: 1.0000 | ORAL_TABLET | Freq: Three times a day (TID) | ORAL | 0 refills | Status: AC | PRN

## 2021-03-01 NOTE — ED Triage Notes (Signed)
Pt reports being awakened at 0300 this am with right sided flank pain, pt reports hx of kidney stones, pt that he had some expired oxycodone and it is helping some, rates his pain a 5/10. Pt is holding his right flank and reports some bouts of nausea

## 2021-03-01 NOTE — Discharge Instructions (Addendum)
Take the prescription meds as directed. Follow-up with Dr. Evelene Croon as needed.

## 2021-03-01 NOTE — ED Notes (Signed)
States he is pain free at present

## 2021-03-01 NOTE — ED Provider Notes (Signed)
[facility] Emergency Department Provider Note ____________________________________________  Time seen: 0804  I have reviewed the triage vital signs and the nursing notes.  HISTORY  Chief Complaint  Flank Pain   HPI Jeremy Castillo is a 55 y.o. male with a history of kidney stones, presents to the ED for sudden onset of right-sided flank pain.  Patient endorses onset of pain about 3:00 this morning, has had some nausea without vomiting, but denies any urinary retention or gross hematuria.  He docent old oxycodone tablet, and reports some benefit with pain relief.  He notes his pain is currently 5 out of 10.  Past Medical History:  Diagnosis Date   Enlarged prostate    History of kidney stones    Kidney stones     There are no problems to display for this patient.   Past Surgical History:  Procedure Laterality Date   COLONOSCOPY     COLONOSCOPY  2020   CYSTOSCOPY WITH INSERTION OF UROLIFT  12/2018   CYSTOSCOPY WITH INSERTION OF UROLIFT     CYSTOSCOPY WITH LITHOLAPAXY N/A 02/03/2020   Procedure: CYSTOSCOPY WITH LITHOLAPAXY W/HOLMIUM LASER;  Surgeon: Orson Ape, MD;  Location: [facility] ORS;  Service: Urology;  Laterality: N/A;   LITHOTRIPSY      Prior to Admission medications   Medication Sig Start Date End Date Taking? Authorizing Provider  oxyCODONE-acetaminophen (PERCOCET) 5-325 MG tablet Take 1 tablet by mouth every 8 (eight) hours as needed for up to 5 days for severe pain. 03/01/21 03/06/21 Yes Whitnee Orzel, Charlesetta Ivory, PA-C  tamsulosin (FLOMAX) 0.4 MG CAPS capsule Take 1 capsule (0.4 mg total) by mouth daily after breakfast for 7 days. 03/01/21 03/08/21 Yes Karagan Lehr, Charlesetta Ivory, PA-C  Meth-Hyo-M Bl-Na Phos-Ph Sal (URIBEL) 118 MG CAPS Take 1 capsule (118 mg total) by mouth every 6 (six) hours as needed (dysuria). 02/03/20   Orson Ape, MD  Multiple Vitamins-Minerals (COMPLETE MULTIVITAMIN/MINERAL PO) Take 1 tablet by mouth.    [provider]  Nutritional Supplements (PROSTATE 2.4) CAPS Take 2 capsules by mouth.    [provider]    Allergies Patient has no known allergies.  History reviewed. No pertinent family history.  Social History Social History   Tobacco Use   Smoking status: Never   Smokeless tobacco: Never  Vaping Use   Vaping Use: Never used  Substance Use Topics   Alcohol use: Yes    Alcohol/week: 3.0 standard drinks    Types: 3 Cans of beer per week   Drug use: Never    Review of Systems  Constitutional: Negative for fever. Eyes: Negative for visual changes. ENT: Negative for sore throat. Cardiovascular: Negative for chest pain. Respiratory: Negative for shortness of breath. Gastrointestinal: Negative for abdominal pain, vomiting and diarrhea.  Right flank pain as above. Genitourinary: Negative for dysuria. Musculoskeletal: Negative for back pain. Skin: Negative for rash. Neurological: Negative for headaches, focal weakness or numbness. ____________________________________________  PHYSICAL EXAM:  VITAL SIGNS: ED Triage Vitals [03/01/21 0710]  Enc Vitals Group     BP (!) 172/90     Pulse Rate (!) 59     Resp 16     Temp 98.2 F (36.8 C)     Temp Source Oral     SpO2 99 %     Weight 175 lb (79.4 kg)     Height 5\' 6"  (1.676 m)     Head Circumference      Peak Flow  Pain Score 5     Pain Loc      Pain Edu?      Excl. in GC?     Constitutional: Alert and oriented. Well appearing and in no distress. Head: Normocephalic and atraumatic. Eyes: Conjunctivae are normal. Normal extraocular movements Cardiovascular: Normal rate, regular rhythm. Normal distal pulses. Respiratory: Normal respiratory effort. No wheezes/rales/rhonchi. Gastrointestinal: Soft and nontender. No distention. Right flank tenderness Musculoskeletal: Nontender with normal range of motion in all extremities.  Neurologic:  Normal gait without ataxia. Normal speech and language. No gross  focal neurologic deficits are appreciated. Skin:  Skin is warm, dry and intact. No rash noted. Psychiatric: Mood and affect are normal. Patient exhibits appropriate insight and judgment. ____________________________________________    {LABS (pertinent positives/negatives)  Labs Reviewed  URINALYSIS, COMPLETE (UACMP) WITH MICROSCOPIC - Abnormal; Notable for the following components:      Result Value   Color, Urine YELLOW (*)    APPearance HAZY (*)    Hgb urine dipstick LARGE (*)    Ketones, ur 20 (*)    Protein, ur 30 (*)    All other components within normal limits  ____________________________________________  {EKG  ____________________________________________   RADIOLOGY Official radiology report(s): CT Renal Stone Study  Result Date: 03/01/2021 CLINICAL DATA:  Right-sided flank pain, history of renal stones. EXAM: CT ABDOMEN AND PELVIS WITHOUT CONTRAST TECHNIQUE: Multidetector CT imaging of the abdomen and pelvis was performed following the standard protocol without IV contrast. COMPARISON:  CT January 24, 2020. FINDINGS: Lower chest: No acute abnormality. Hepatobiliary: Unremarkable noncontrast appearance of the hepatic parenchyma. Gallbladder is unremarkable. No biliary ductal dilation. Pancreas: Unremarkable noncontrast appearance of the pancreatic parenchyma. No pancreatic ductal dilation. Spleen: Within normal limits. Adrenals/Urinary Tract: Bilateral adrenal glands are unremarkable. RIGHT perinephric and periureteric stranding with hydroureteronephrosis to the level of a 4 mm in the proximal ureter at the level of the L3-L4 vertebral bodies. Bilateral renal sinus cysts. Two additional nonobstructive RIGHT renal calculi measuring up to 3 mm. No LEFT renal calculi. Urinary bladder is mildly distended with increased attenuation fluid greater than that expected for urine possibly representing hemorrhage or debris. Stomach/Bowel: Stomach is within normal limits. Appendix appears normal.  No evidence of bowel wall thickening, distention, or inflammatory changes. Vascular/Lymphatic: Aortic and branch vessel atherosclerosis without abdominal aortic aneurysm. No pathologically enlarged abdominal or pelvic lymph nodes. 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 Other: No significant abdominopelvic ascites. No walled off fluid collections. No pneumoperitoneum. Musculoskeletal: No acute or significant osseous findings. IMPRESSION: 1. Obstructing 4 mm proximal right ureteral calculus with associated mild right hydroureteronephrosis. 2. Additional nonobstructive RIGHT nephrolithiasis measuring up to 3 mm. 3. Increased attenuation fluid within the urinary bladder greater than that expected for urine possibly representing hemorrhage or debris. Correlate with urinalysis is suggested. 4.  Aortic Atherosclerosis (ICD10-I70.0). Electronically Signed   By: Maudry Mayhew M.D.   On: 03/01/2021 09:56   ____________________________________________  PROCEDURES  Toradol 30 mg IVP NS 1000 ml IVP Tamsulosin 0.4 mg PO  Procedures ____________________________________________   INITIAL IMPRESSION / ASSESSMENT AND PLAN / ED COURSE  As part of my medical decision making, I reviewed the following data within the electronic MEDICAL RECORD NUMBER Labs reviewed WNL, Radiograph reviewed as noted, Notes from prior ED visits, and  Controlled Substance Database    Hydronephrosis, pyelonephritis, hydroureteronephrosis   Patient with a history of kidney stones, presents to the ED with acute right flank pain.  Patient was evaluate was complaints in the ED, and found  to have a full meal obstructive stone in the midportion of the right ureter, with some mild hydroureteronephrosis.  He does endorse improvement of his pain after the administration of of some IV pain medicines and fluids.  Patient is inclined to follow-up with Dr. Evelene Croon for any ongoing symptoms.  Return precautions have been reviewed.  A prescription  for Flomax and exits provided for his benefit.  Return precautions have been reviewed.    Jeremy Castillo was evaluated in Emergency Department on 03/01/2021 for the symptoms described in the history of present illness. He was evaluated in the context of the global COVID-19 pandemic, which necessitated consideration that the patient might be at risk for infection with the SARS-CoV-2 virus that causes COVID-19. Institutional protocols and algorithms that pertain to the evaluation of patients at risk for COVID-19 are in a state of rapid change based on information released by regulatory bodies including the CDC and federal and state organizations. These policies and algorithms were followed during the patient's care in the ED.  I reviewed the patient's prescription history over the last 12 months in the multi-state controlled substances database(s) that includes [place], [place], [place], [place], [place], [place], [place], [place], [place], [place], [place], [place], [place], and [place].  Results were notable for no current RX ____________________________________________  FINAL CLINICAL IMPRESSION(S) / ED DIAGNOSES  Final diagnoses:  Right flank pain  Hydroureteronephrosis      Zarianna Dicarlo, Charlesetta Ivory, PA-C 03/01/21 1447    Shaune Pollack, MD 03/04/21 [phone]

## 2021-03-01 NOTE — ED Notes (Signed)
See triage note  Presents with right flank pain this am  Afebrile on arrival

## 2021-03-03 ENCOUNTER — Other Ambulatory Visit: Payer: Self-pay

## 2021-03-03 DIAGNOSIS — R109 Unspecified abdominal pain: Secondary | ICD-10-CM | POA: Diagnosis present

## 2021-03-03 DIAGNOSIS — N201 Calculus of ureter: Secondary | ICD-10-CM | POA: Diagnosis not present

## 2021-03-03 MED ORDER — FENTANYL CITRATE (PF) 100 MCG/2ML IJ SOLN
50.0000 ug | INTRAMUSCULAR | Status: DC | PRN
  Administered 2021-03-03: 50 ug via NASAL
  Filled 2021-03-03: qty 2

## 2021-03-03 NOTE — ED Triage Notes (Signed)
Patient reports dx'd Thursday with flank pain reports pain is not responding to prescribed medication.

## 2021-03-04 ENCOUNTER — Emergency Department
Admission: EM | Admit: 2021-03-04 | Discharge: 2021-03-04 | Disposition: A | Discharge status: Home / Self Care | Payer: 59 | Attending: Emergency Medicine | Admitting: Emergency Medicine

## 2021-03-04 DIAGNOSIS — N200 Calculus of kidney: Secondary | ICD-10-CM

## 2021-03-04 LAB — URINALYSIS, COMPLETE (UACMP) WITH MICROSCOPIC
Bacteria, UA: NONE SEEN
Bilirubin Urine: NEGATIVE
Glucose, UA: NEGATIVE mg/dL
Ketones, ur: 80 mg/dL — AB
Leukocytes,Ua: NEGATIVE
Nitrite: NEGATIVE
Protein, ur: NEGATIVE mg/dL
Specific Gravity, Urine: 1.02 (ref 1.005–1.030)
Squamous Epithelial / HPF: NONE SEEN (ref 0–5)
pH: 5 (ref 5.0–8.0)

## 2021-03-04 MED ORDER — KETOROLAC TROMETHAMINE 30 MG/ML IJ SOLN
30.0000 mg | Freq: Once | INTRAMUSCULAR | Status: AC
  Administered 2021-03-04: 30 mg via INTRAVENOUS
  Filled 2021-03-04: qty 1

## 2021-03-04 MED ORDER — ONDANSETRON HCL 4 MG/2ML IJ SOLN
4.0000 mg | Freq: Once | INTRAMUSCULAR | Status: AC
  Administered 2021-03-04: 4 mg via INTRAVENOUS
  Filled 2021-03-04: qty 2

## 2021-03-04 MED ORDER — HYDROMORPHONE HCL 1 MG/ML IJ SOLN
1.0000 mg | Freq: Once | INTRAMUSCULAR | Status: AC
  Administered 2021-03-04: 1 mg via INTRAVENOUS
  Filled 2021-03-04: qty 1

## 2021-03-04 MED ORDER — SODIUM CHLORIDE 0.9 % IV BOLUS (SEPSIS)
1000.0000 mL | Freq: Once | INTRAVENOUS | Status: AC
  Administered 2021-03-04: 1000 mL via INTRAVENOUS

## 2021-03-04 NOTE — ED Provider Notes (Signed)
[facility] Emergency Department Provider Note  ____________________________________________   Event Date/Time   First MD Initiated Contact with Patient 03/04/21 0126     (approximate)  I have reviewed the triage vital signs and the nursing notes.   HISTORY  Chief Complaint Flank Pain    HPI Jeremy Castillo is a 55 y.o. male history of kidney stones who presents to the emergency department with right flank pain.  States it became very severe tonight and was not controlled with Percocet 5 mg at home.  States he is prescribed Percocet 5/325 mg to take 1 tablet every 8 hours.  Was seen in the emergency department on 03/01/2021 and had a CT scan that showed an obstructing 4 mm proximal right ureteral calculus with mild right hydroureteronephrosis.  States he is followed by Dr. Evelene Croon with urology.  States he had nausea tonight without vomiting.  No fever.  No dysuria.  Given fentanyl in triage and reports feeling better.         Past Medical History:  Diagnosis Date   Enlarged prostate    History of kidney stones    Kidney stones     There are no problems to display for this patient.   Past Surgical History:  Procedure Laterality Date   COLONOSCOPY     COLONOSCOPY  2020   CYSTOSCOPY WITH INSERTION OF UROLIFT  12/2018   CYSTOSCOPY WITH INSERTION OF UROLIFT     CYSTOSCOPY WITH LITHOLAPAXY N/A 02/03/2020   Procedure: CYSTOSCOPY WITH LITHOLAPAXY W/HOLMIUM LASER;  Surgeon: Orson Ape, MD;  Location: [facility] ORS;  Service: Urology;  Laterality: N/A;   LITHOTRIPSY      Prior to Admission medications   Medication Sig Start Date End Date Taking? Authorizing Provider  Meth-Hyo-M Bl-Na Phos-Ph Sal (URIBEL) 118 MG CAPS Take 1 capsule (118 mg total) by mouth every 6 (six) hours as needed (dysuria). 02/03/20   Orson Ape, MD  Multiple Vitamins-Minerals (COMPLETE MULTIVITAMIN/MINERAL PO) Take 1 tablet by mouth.    [provider]  Nutritional  Supplements (PROSTATE 2.4) CAPS Take 2 capsules by mouth.    [provider]  oxyCODONE-acetaminophen (PERCOCET) 5-325 MG tablet Take 1 tablet by mouth every 8 (eight) hours as needed for up to 5 days for severe pain. 03/01/21 03/06/21  Menshew, Charlesetta Ivory, PA-C  tamsulosin (FLOMAX) 0.4 MG CAPS capsule Take 1 capsule (0.4 mg total) by mouth daily after breakfast for 7 days. 03/01/21 03/08/21  Menshew, Charlesetta Ivory, PA-C    Allergies Patient has no known allergies.  No family history on file.  Social History Social History   Tobacco Use   Smoking status: Never   Smokeless tobacco: Never  Vaping Use   Vaping Use: Never used  Substance Use Topics   Alcohol use: Yes    Alcohol/week: 3.0 standard drinks    Types: 3 Cans of beer per week   Drug use: Never    Review of Systems Constitutional: No fever. Eyes: No visual changes. ENT: No sore throat. Cardiovascular: Denies chest pain. Respiratory: Denies shortness of breath. Gastrointestinal: No vomiting, diarrhea. Genitourinary: Negative for dysuria. Musculoskeletal: Negative for back pain. Skin: Negative for rash. Neurological: Negative for focal weakness or numbness.  ____________________________________________   PHYSICAL EXAM:  VITAL SIGNS: ED Triage Vitals  Enc Vitals Group     BP 03/03/21 2042 (!) 154/85     Pulse Rate 03/03/21 2042 74     Resp 03/03/21 2042 (!) 22  Temp 03/03/21 2042 97.8 F (36.6 C)     Temp Source 03/03/21 2042 Oral     SpO2 03/03/21 2042 100 %     Weight 03/03/21 2035 174 lb 13.2 oz (79.3 kg)     Height 03/03/21 2035 5\' 6"  (1.676 m)     Head Circumference --      Peak Flow --      Pain Score 03/03/21 2034 8     Pain Loc --      Pain Edu? --      Excl. in GC? --    CONSTITUTIONAL: Alert and oriented and responds appropriately to questions. Well-appearing; well-nourished HEAD: Normocephalic EYES: Conjunctivae clear, pupils appear equal, EOM appear intact ENT: normal nose;  moist mucous membranes NECK: Supple, normal ROM CARD: RRR; S1 and S2 appreciated; no murmurs, no clicks, no rubs, no gallops RESP: Normal chest excursion without splinting or tachypnea; breath sounds clear and equal bilaterally; no wheezes, no rhonchi, no rales, no hypoxia or respiratory distress, speaking full sentences ABD/GI: Normal bowel sounds; non-distended; soft, non-tender, no rebound, no guarding, no peritoneal signs, no hepatosplenomegaly BACK: The back appears normal EXT: Normal ROM in all joints; no deformity noted, no edema; no cyanosis SKIN: Normal color for age and race; warm; no rash on exposed skin NEURO: Moves all extremities equally PSYCH: The patient's mood and manner are appropriate.  ____________________________________________   LABS (all labs ordered are listed, but only abnormal results are displayed)  Labs Reviewed  URINALYSIS, COMPLETE (UACMP) WITH MICROSCOPIC - Abnormal; Notable for the following components:      Result Value   Hgb urine dipstick LARGE (*)    Ketones, ur 80 (*)    All other components within normal limits   ____________________________________________  EKG   ____________________________________________  RADIOLOGY I, Sawyer Mentzer, personally viewed and evaluated these images (plain radiographs) as part of my medical decision making, as well as reviewing the written report by the radiologist.  ED MD interpretation:    Official radiology report(s): No results found.  ____________________________________________   PROCEDURES  Procedure(s) performed (including Critical Care):  Procedures   ____________________________________________   INITIAL IMPRESSION / ASSESSMENT AND PLAN / ED COURSE  As part of my medical decision making, I reviewed the following data within the electronic MEDICAL RECORD NUMBER History obtained from family, Nursing notes reviewed and incorporated, Labs reviewed , Old chart reviewed, and Notes from prior ED  visits         Patient here for flank pain and has a known 4 mm right proximal ureteral stone.  Given fentanyl in triage and reports feeling better.  Still having some residual pain.  Urine does not appear infected today and he is afebrile.  Abdominal exam benign.  Will give Toradol, Zofran, Dilaudid and IV fluids.  ED PROGRESS  On reassessment patient reports he is feeling great.  He is comfortable with plan for discharge home with outpatient follow-up with his urologist.  I have advised him that he can increase his Percocet and take 2 tablets every 6 hours as needed along with ibuprofen.  He is on Flomax.  Discussed return precautions with patient and wife.  They are comfortable with this plan.  States he has a refill of Percocet at his pharmacy if needed.  At this time, I do not feel there is any life-threatening condition present. I have reviewed, interpreted and discussed all results (EKG, imaging, lab, urine as appropriate) and exam findings with patient/family. I have reviewed nursing notes  and appropriate previous records.  I feel the patient is safe to be discharged home without further emergent workup and can continue workup as an outpatient as needed. Discussed usual and customary return precautions. Patient/family verbalize understanding and are comfortable with this plan.  Outpatient follow-up has been provided as needed. All questions have been answered.  ____________________________________________   FINAL CLINICAL IMPRESSION(S) / ED DIAGNOSES  Final diagnoses:  Right kidney stone     ED Discharge Orders     None       *Please note:  Jeremy Castillo was evaluated in Emergency Department on 03/04/2021 for the symptoms described in the history of present illness. He was evaluated in the context of the global COVID-19 pandemic, which necessitated consideration that the patient might be at risk for infection with the SARS-CoV-2 virus that causes COVID-19. Institutional  protocols and algorithms that pertain to the evaluation of patients at risk for COVID-19 are in a state of rapid change based on information released by regulatory bodies including the CDC and federal and state organizations. These policies and algorithms were followed during the patient's care in the ED.  Some ED evaluations and interventions may be delayed as a result of limited staffing during and the pandemic.*   Note:  This document was prepared using Dragon voice recognition software and may include unintentional dictation errors.    Caylan Schifano, Layla Maw, DO 03/04/21 [id number]

## 2021-03-04 NOTE — ED Notes (Signed)
ED Provider at bedside. 

## 2021-03-04 NOTE — Discharge Instructions (Addendum)
You may take 2 Percocet tablets every 6 hours as needed for pain control.  You may also take 800 mg of ibuprofen every 8 hours as needed for pain.  Please take ibuprofen with food.  Recommend close follow-up with your urologist if symptoms are not improving.  If you develop worsening pain, fever of 100.4 or higher, vomiting that does not stop, unable to urinate in several hours, please return to the emergency department.

## 2021-03-04 NOTE — ED Notes (Signed)
Patient presents to the ED with request for more pain medication for known kidney stone. Patient reports taking Oxycodone at home, but reports minimal pain relief. Patient reports pain relief with Fentanyl in triage. Patient alert, oriented x4, with even and unlabored respirations.

## 2021-03-05 ENCOUNTER — Ambulatory Visit: Payer: 59 | Admitting: Occupational Therapy

## 2021-03-05 DIAGNOSIS — M25631 Stiffness of right wrist, not elsewhere classified: Secondary | ICD-10-CM

## 2021-03-05 DIAGNOSIS — G5601 Carpal tunnel syndrome, right upper limb: Secondary | ICD-10-CM

## 2021-03-05 DIAGNOSIS — M6281 Muscle weakness (generalized): Secondary | ICD-10-CM

## 2021-03-05 NOTE — Therapy (Signed)
[facility] [address], [place], [address] Phone: [phone]   Fax:  [phone]  Occupational Therapy Treatment  Patient Details  Name: Jeremy Castillo MRN: [id number] Date of Birth: Apr 11, 1966 Referring Provider (OT): DR Glo Herring Date: 03/05/2021   OT End of Session - 03/05/21 0900     Visit Number 2    Number of Visits 4    Date for OT Re-Evaluation 04/02/21    OT Start Time 0733    OT Stop Time 0813    OT Time Calculation (min) 40 min    Activity Tolerance Patient tolerated treatment well    Behavior During Therapy [facility] for tasks assessed/performed             Past Medical History:  Diagnosis Date   Enlarged prostate    History of kidney stones    Kidney stones     Past Surgical History:  Procedure Laterality Date   COLONOSCOPY     COLONOSCOPY  2020   CYSTOSCOPY WITH INSERTION OF UROLIFT  12/2018   CYSTOSCOPY WITH INSERTION OF UROLIFT     CYSTOSCOPY WITH LITHOLAPAXY N/A 02/03/2020   Procedure: CYSTOSCOPY WITH LITHOLAPAXY W/HOLMIUM LASER;  Surgeon: Orson Ape, MD;  Location: [facility] ORS;  Service: Urology;  Laterality: N/A;   LITHOTRIPSY      There were no vitals filed for this visit.   Subjective Assessment - 03/05/21 0858     Subjective  I was since i seen you 2 x in the ED for kidney stone - just feels tired and drained - hands feels little better- R hand not as tender and numbness not as much during night - wearing my wrist splints    Pertinent History Pt was seen in 2016 for CTS and 2018 for R cubital tunnel - improved both times with hand therapy - pt refer by PCP for R wrist tightness, stiffness and some numbness that started about 3-4 months ago    Patient Stated Goals I want my wrists better so I don't need surgery    Currently in Pain? No/denies                [facility] OT Assessment - 03/05/21 0001       AROM   Right Wrist Extension 65 Degrees     Right Wrist Flexion 110 Degrees    Left Wrist Extension 70 Degrees    Left Wrist Flexion 110 Degrees      Strength   Right Hand Grip (lbs) 65    Left Hand Grip (lbs) 80                      OT Treatments/Exercises (OP) - 03/05/21 0001       RUE Contrast Bath   Time 8 minutes    Comments R wrist and forearm prior to soft tissue      LUE Contrast Bath   Time 8 minutes    Comments R wrist and hand piror to soft tissue            CT spreads and MC spreads done bilaterally -and graston tool nr 2 sweeping over volar forearm and wrist - prior to stretches     review soft tissue mobs - wife to assist 1  x day   Wife to assist to massage volar wrist and forearm Wrist extention stretch 2 x day  But add and change to do composite  extention stretch or nerve glide on wall after shower - but no weight bearing thru palm  After contrast / or shower And then Med and ulnar N glide - 5 reps - 2 x day  Pain free  And enlarge objects and use larger joints pick up and carry   Cont with wrist splints at night time       OT Education - 03/05/21 0859     Education provided Yes    Education Details progress and changes to HEP    Person(s) Educated Patient    Methods Explanation;Verbal cues;Handout;Tactile cues    Comprehension Verbalized understanding;Returned demonstration;Verbal cues required              OT Short Term Goals - 08/15/16 0819       OT SHORT TERM GOAL #1   Title Pain on PREE improve with 10 points at least     Baseline pain on PREE for eval 15/50 - now 4/50    Status Achieved               OT Long Term Goals - 02/19/21 1947       OT LONG TERM GOAL #1   Title Pt to be ind in HEP for stretches, splint wearing and modifications to decrease symptoms    Baseline very little knowledge, some numbness at night time , tightness volar wrist and tenderness    Time 4    Period Weeks    Target Date 03/19/21      OT LONG TERM GOAL #2   Title R  wrist AROM and grip strength increase to WNL compare to L wrist ROM and grip strength 4 yrs ago    Baseline R grip strength decrease , and R wrist ext and flexion decrease compare to L    Time 6    Period Weeks    Status New    Target Date 04/02/21                   Plan - 03/05/21 0900     Clinical Impression Statement Pt was seen in 2016 and 2018 by OT for bilateral CTS and R cubital tunnel - pt showed improvement both times with OT. Pt  this time refered by PCP with CTS on the R dominant wrist- tenderness , tightness and stiffness in R wrist and decrease strength in grip and 3 point pinch - show tenderness and some sensory changes at night time- pt was negative for Tinel and Phalen's - pt show decrease tenderness over R CT , increase wrist flexion but cont to show increase tightenss in volar forearm over flexors and decrease wrist ext on R - grip decrease this date - but could be because of kidney stone issues - review again with pt HEP to use at home and pt can benefit from OT services to decrease stiffness, tenderness and sensory changes    OT Occupational Profile and History Problem Focused Assessment - Including review of records relating to presenting problem    Occupational performance deficits (Please refer to evaluation for details): IADL's;Rest and Sleep;Work;Leisure    Body Structure / Function / Physical Skills ADL;Strength;Pain;UE functional use;Flexibility;IADL    Rehab Potential Fair    Clinical Decision Making Limited treatment options, no task modification necessary    Comorbidities Affecting Occupational Performance: None    Modification or Assistance to Complete Evaluation  No modification of tasks or assist necessary to complete eval    OT Frequency Biweekly  OT Duration 6 weeks    OT Treatment/Interventions Self-care/ADL training;Contrast Bath;Manual Therapy;Patient/family education;Splinting;DME and/or AE instruction    OT Home Exercise Plan see pt  instructions    Consulted and Agree with Plan of Care Patient             Patient will benefit from skilled therapeutic intervention in order to improve the following deficits and impairments:   Body Structure / Function / Physical Skills: ADL, Strength, Pain, UE functional use, Flexibility, IADL       Visit Diagnosis: Carpal tunnel syndrome, right upper limb  Stiffness of right wrist, not elsewhere classified  Muscle weakness (generalized)    Problem List There are no problems to display for this patient.   Darlena Koval OTR/L, CLT 03/05/2021, 9:03 AM   [facility] [address] [place], [address] Phone: [phone]   Fax:  [phone]  Name: Jeremy Castillo MRN: [id number] Date of Birth: 09/01/1965

## 2021-03-20 ENCOUNTER — Ambulatory Visit: Payer: 59 | Attending: Internal Medicine | Admitting: Occupational Therapy

## 2021-03-20 DIAGNOSIS — M25631 Stiffness of right wrist, not elsewhere classified: Secondary | ICD-10-CM | POA: Diagnosis present

## 2021-03-20 DIAGNOSIS — G5601 Carpal tunnel syndrome, right upper limb: Secondary | ICD-10-CM | POA: Diagnosis present

## 2021-03-20 DIAGNOSIS — M6281 Muscle weakness (generalized): Secondary | ICD-10-CM | POA: Diagnosis present

## 2021-03-20 NOTE — Therapy (Signed)
[facility] [address] [place], [place], [address] Phone: [phone]   Fax:  [phone]  Occupational Therapy Treatment  Patient Details  Name: Jeremy Castillo MRN: [id number] Date of Birth: 1966-04-27 Referring Provider (OT): DR Glo Herring Date: 03/20/2021   OT End of Session - 03/20/21 0850     Visit Number 3    Number of Visits 4    Date for OT Re-Evaluation 04/02/21    OT Start Time 0850    OT Stop Time 0935    OT Time Calculation (min) 45 min    Activity Tolerance Patient tolerated treatment well    Behavior During Therapy [facility] for tasks assessed/performed             Past Medical History:  Diagnosis Date   Enlarged prostate    History of kidney stones    Kidney stones     Past Surgical History:  Procedure Laterality Date   COLONOSCOPY     COLONOSCOPY  2020   CYSTOSCOPY WITH INSERTION OF UROLIFT  12/2018   CYSTOSCOPY WITH INSERTION OF UROLIFT     CYSTOSCOPY WITH LITHOLAPAXY N/A 02/03/2020   Procedure: CYSTOSCOPY WITH LITHOLAPAXY W/HOLMIUM LASER;  Surgeon: Orson Ape, MD;  Location: [facility] ORS;  Service: Urology;  Laterality: N/A;   LITHOTRIPSY      There were no vitals filed for this visit.   Subjective Assessment - 03/20/21 0849     Subjective  Doing well - done some work in the yard [employer] did bother me little - but otherwise doing better - did get this little cut in my palm that bothers me some    Pertinent History Pt was seen in 2016 for CTS and 2018 for R cubital tunnel - improved both times with hand therapy - pt refer by PCP for R wrist tightness, stiffness and some numbness that started about 3-4 months ago    Patient Stated Goals I want my wrists better so I don't need surgery    Currently in Pain? No/denies                [facility] OT Assessment - 03/20/21 0001       AROM   Right Wrist Extension 70 Degrees    Right Wrist Flexion 105 Degrees     Left Wrist Flexion 105 Degrees      Strength   Right Hand Grip (lbs) 70   70 extended arm - cut in palm   Right Hand Lateral Pinch 21 lbs    Right Hand 3 Point Pinch 23 lbs    Left Hand Grip (lbs) 85   90 ext arm   Left Hand Lateral Pinch 21 lbs    Left Hand 3 Point Pinch 25 lbs                      OT Treatments/Exercises (OP) - 03/20/21 0001       Moist Heat Therapy   Number Minutes Moist Heat 8 Minutes    Moist Heat Location Hand;Wrist             CT spreads and MC spreads done bilaterally -and graston tool nr 2 sweeping over volar forearm and wrist - prior to stretches - great improvement in tightness     review soft tissue mobs - wife to assist 1  x day  as needed after using his hands  a lot  Wrist extention stretch  Can also do  composite extention stretch or nerve glide on wall after shower - but no weight bearing thru palm  After contrast / or shower And then Med and ulnar N glide - 5 reps - 2 x day  after wrist stretches Pain free  Cont to  enlarge objects and use larger joints pick up and carry  Padded gloves for vibration tools  Cont with wrist splints at night time  And positioning for cubital tunnel on the R - sitting at desk, driving and sleeping        OT Education - 03/20/21 0850     Education provided Yes    Education Details progress and changes to HEP    Person(s) Educated Patient    Methods Explanation;Verbal cues;Handout;Tactile cues    Comprehension Verbalized understanding;Returned demonstration;Verbal cues required                 OT Long Term Goals - 03/20/21 0942       OT LONG TERM GOAL #1   Title Pt to be ind in HEP for stretches, splint wearing and modifications to decrease symptoms    Baseline very little knowledge, some numbness at night time , tightness volar wrist and tenderness  -AROM back to WNL , no numbness at night time- ind in HEP    Status Achieved      OT LONG TERM GOAL #2   Title R wrist AROM  and grip strength increase to WNL compare to L wrist ROM and grip strength 4 yrs ago    Baseline R grip strength decrease , and R wrist ext and flexion decrease compare to L  - NOW AROM WNL - grip and prehension increase    Status Achieved                   Plan - 03/20/21 0850     Clinical Impression Statement Pt was seen in 2016 and 2018 by OT for bilateral CTS and R cubital tunnel - pt showed improvement both times with OT. Pt  this time refered by PCP with CTS on the R dominant wrist- tenderness , tightness and stiffness in R wrist and decrease strength in grip and 3 point pinch - had tenderness and some sensory changes at night time- pt was negative for Tinel and Phalen's - pt this date at 3 visits show no tenderness over CT , AROM for wrist flexion , ext WNL - no tightness in volar forearm and grip and prehension pinch increase - grip still decrease on R - but pt has cut in his palm. Has posittive Tinel at R cubital tunnel but now symptoms in every day activities - aware of modifications and position at desk and night time - p e of kidney stone issues - Pt to cont with same HEP  and contact me in the next 4-8 wks if need follow up    OT Occupational Profile and History Problem Focused Assessment - Including review of records relating to presenting problem    Occupational performance deficits (Please refer to evaluation for details): IADL's;Rest and Sleep;Work;Leisure    Body Structure / Function / Physical Skills ADL;Strength;Pain;UE functional use;Flexibility;IADL    Rehab Potential Fair    Clinical Decision Making Limited treatment options, no task modification necessary    Comorbidities Affecting Occupational Performance: None    Modification or Assistance to Complete Evaluation  No modification of tasks or assist necessary to complete eval  OT Frequency Biweekly    OT Duration 6 weeks    OT Treatment/Interventions Self-care/ADL training;Contrast Bath;Manual  Therapy;Patient/family education;Splinting;DME and/or AE instruction    OT Home Exercise Plan see pt instructions    Consulted and Agree with Plan of Care Patient             Patient will benefit from skilled therapeutic intervention in order to improve the following deficits and impairments:   Body Structure / Function / Physical Skills: ADL, Strength, Pain, UE functional use, Flexibility, IADL       Visit Diagnosis: Carpal tunnel syndrome, right upper limb  Stiffness of right wrist, not elsewhere classified  Muscle weakness (generalized)    Problem List There are no problems to display for this patient.   Oletta Cohn OTR/L,CLT 03/20/2021, 9:44 AM  [facility] [address] [place], [address] Phone: [phone]   Fax:  [phone]  Name: Jeremy Castillo MRN: [id number] Date of Birth: 1966-01-19

## 2022-06-27 IMAGING — CT CT RENAL STONE PROTOCOL
2 of 4 series · 15 of 46 positions shown, 17 images · non-contrast
Comparison: CT January 24, 2020.

CLINICAL DATA: Right-sided flank pain, history of renal stones.

EXAM:
CT ABDOMEN AND PELVIS WITHOUT CONTRAST
TECHNIQUE: Multidetector CT imaging of the abdomen and pelvis was performed
following the standard protocol without IV contrast.

[Series 2: stone full standard · axial · 0.69mm/px · z∈[-952,-512]mm · 12 of 98 slices shown, 14 images]
[im 5/98  soft-tissue]
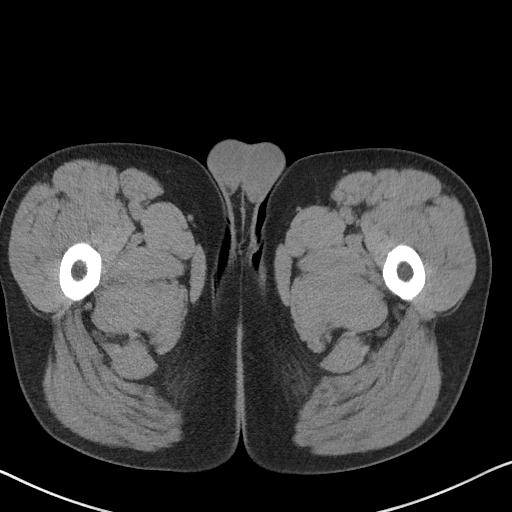
[im 5/98  bone]
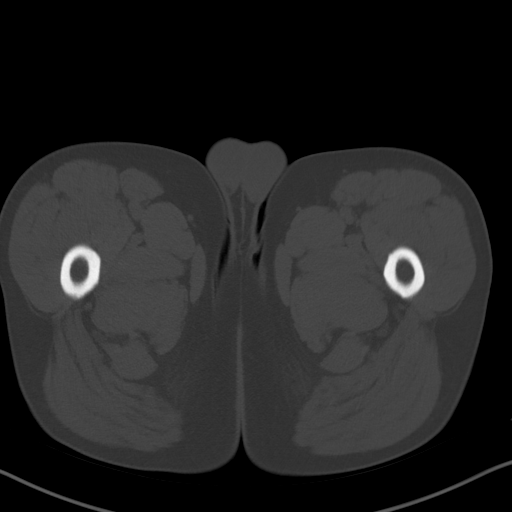
[im 13/98  soft-tissue]
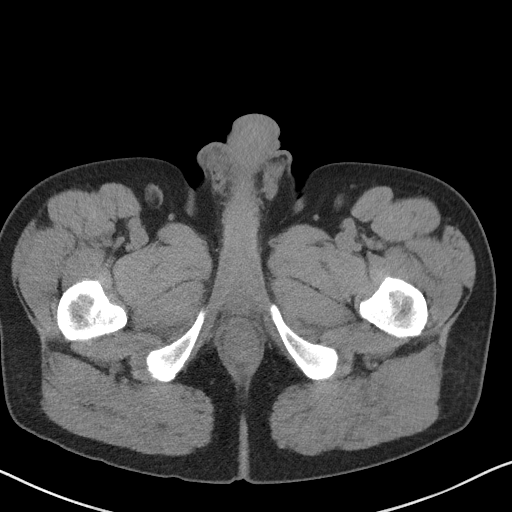
[im 21/98  soft-tissue]
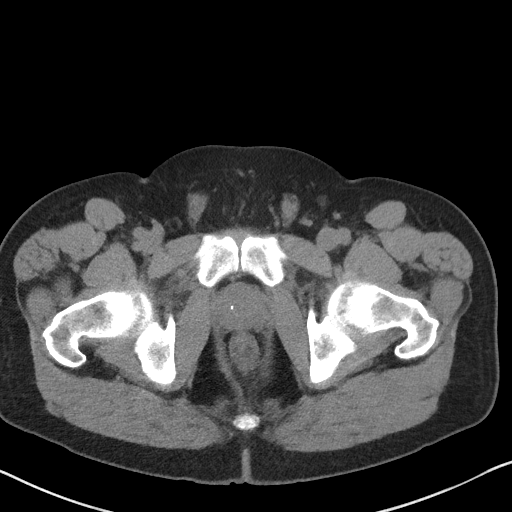
[im 29/98  soft-tissue]
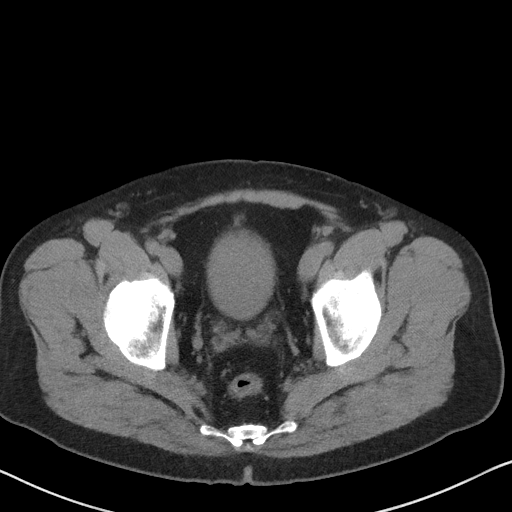
[im 37/98  soft-tissue]
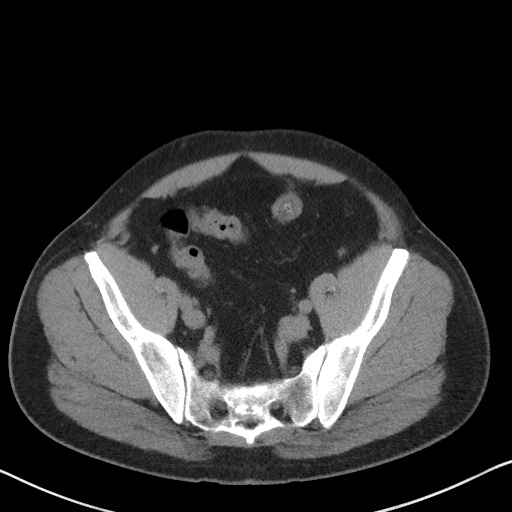
[im 45/98  soft-tissue]
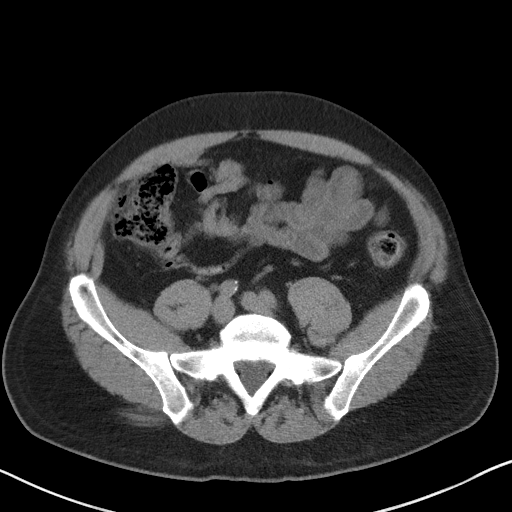
[im 53/98  soft-tissue]
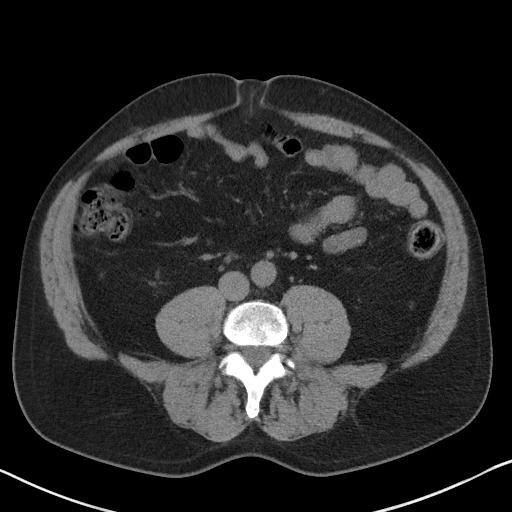
[im 61/98  soft-tissue]
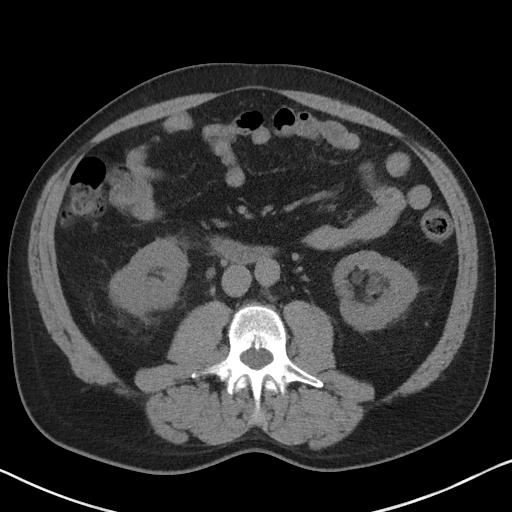
[im 69/98  soft-tissue]
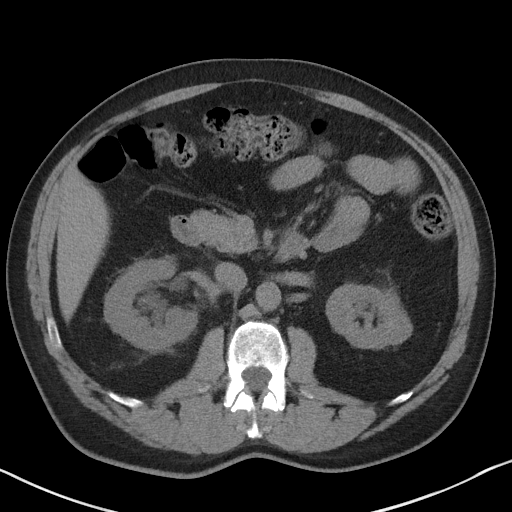
[im 69/98  bone]
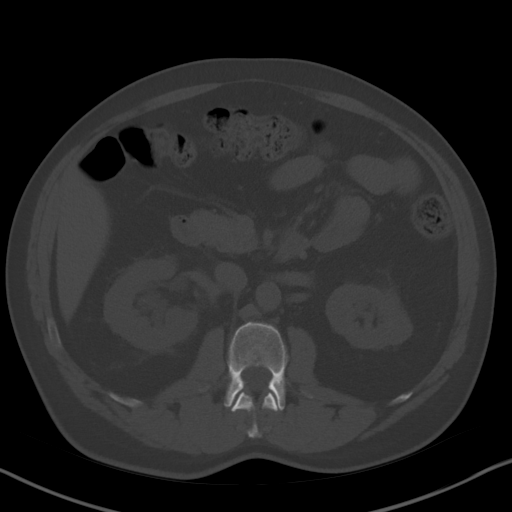
[im 77/98  soft-tissue]
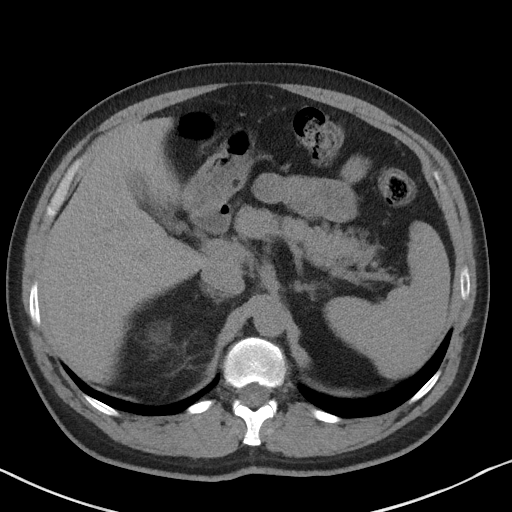
[im 85/98  soft-tissue]
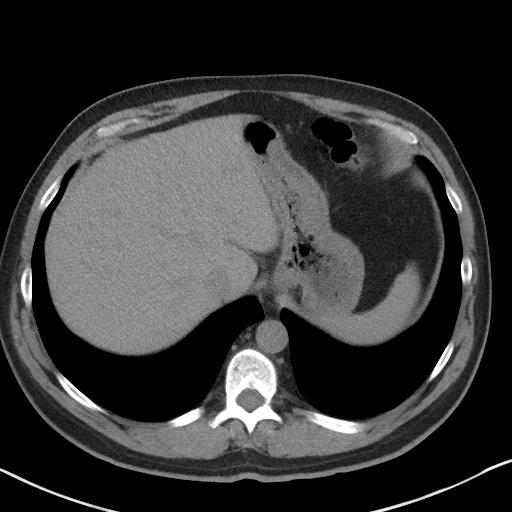
[im 93/98  soft-tissue]
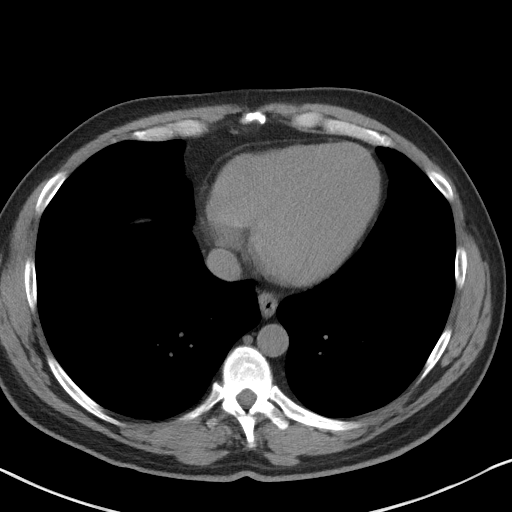

[Series 5: coronal · coronal · 0.72mm/px · 3 of 152 slices shown]
[im 51/152  soft-tissue]
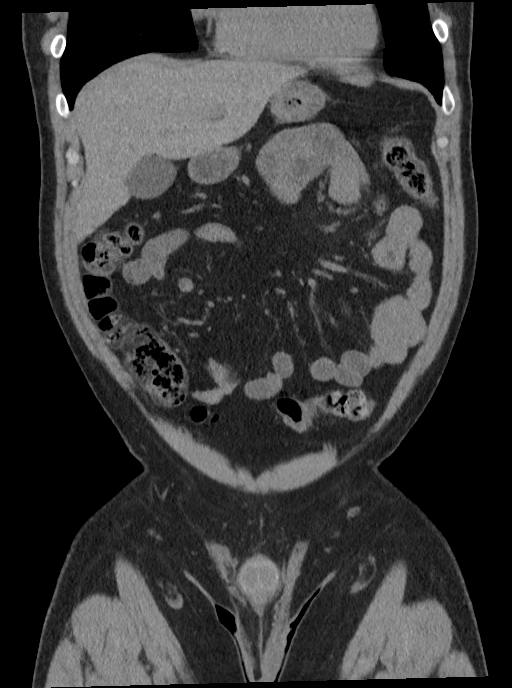
[im 68/152  soft-tissue]
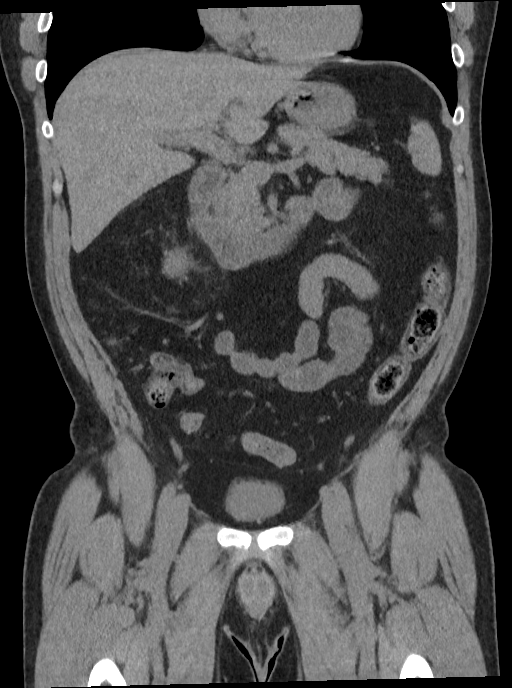
[im 84/152  soft-tissue]
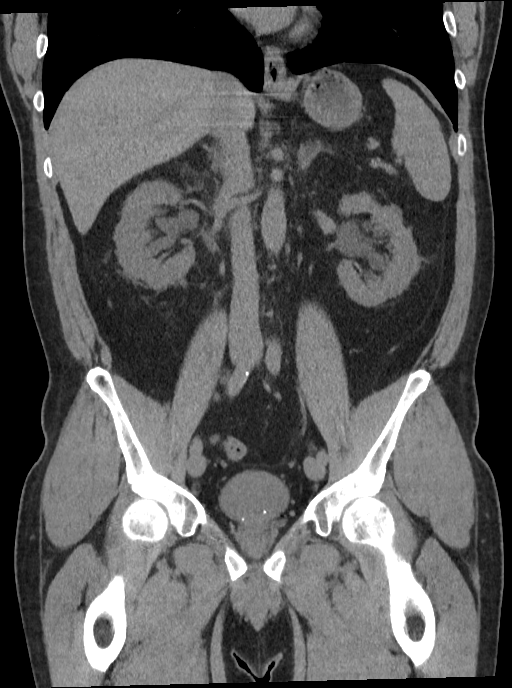

[15 of 46 positions shown; findings below may reference images not displayed]

FINDINGS: Lower chest: No acute abnormality.

Hepatobiliary: Unremarkable noncontrast appearance of the hepatic
parenchyma. Gallbladder is unremarkable. No biliary ductal dilation.

Pancreas: Unremarkable noncontrast appearance of the pancreatic
parenchyma. No pancreatic ductal dilation.

Spleen: Within normal limits.

Adrenals/Urinary Tract: Bilateral adrenal glands are unremarkable.

RIGHT perinephric and periureteric stranding with
hydroureteronephrosis to the level of a 4 mm in the proximal ureter
at the level of the L3-L4 vertebral bodies. Bilateral renal sinus
cysts. Two additional nonobstructive RIGHT renal calculi measuring
up to 3 mm. No LEFT renal calculi.

Urinary bladder is mildly distended with increased attenuation fluid
greater than that expected for urine possibly representing
hemorrhage or debris.

Stomach/Bowel: Stomach is within normal limits. Appendix appears
normal. No evidence of bowel wall thickening, distention, or
inflammatory changes.

Vascular/Lymphatic: Aortic and branch vessel atherosclerosis without
abdominal aortic aneurysm. No pathologically enlarged abdominal or
pelvic lymph nodes.

Reproductive: Brachytherapy seeds in the prostate.

Other: No significant abdominopelvic ascites. No walled off fluid
collections. No pneumoperitoneum.

Musculoskeletal: No acute or significant osseous findings.
IMPRESSION: 1. Obstructing 4 mm proximal right ureteral calculus with associated
mild right hydroureteronephrosis.
2. Additional nonobstructive RIGHT nephrolithiasis measuring up to 3
mm.
3. Increased attenuation fluid within the urinary bladder greater
than that expected for urine possibly representing hemorrhage or
debris. Correlate with urinalysis is suggested.
4.  Aortic Atherosclerosis ([id number]).

## 2022-10-30 ENCOUNTER — Encounter: Payer: Self-pay | Admitting: Urology

## 2022-10-30 ENCOUNTER — Ambulatory Visit: Payer: 59 | Admitting: Urology

## 2022-10-30 VITALS — BP 157/97 | HR 82 | Ht 67.0 in | Wt 173.0 lb

## 2022-10-30 DIAGNOSIS — Z125 Encounter for screening for malignant neoplasm of prostate: Secondary | ICD-10-CM | POA: Diagnosis not present

## 2022-10-30 DIAGNOSIS — Z87442 Personal history of urinary calculi: Secondary | ICD-10-CM

## 2022-10-30 DIAGNOSIS — N400 Enlarged prostate without lower urinary tract symptoms: Secondary | ICD-10-CM

## 2022-10-30 NOTE — Progress Notes (Signed)
I, DeAsia L Maxie,acting as a scribe for Jeremy Altes, MD.,have documented all relevant documentation on the behalf of Jeremy Altes, MD,as directed by  Jeremy Altes, MD while in the presence of Jeremy Altes, MD.   10/30/22 9:16 AM   Jeremy Castillo 02-20-1966 [id number]  Referring provider: Lauro Regulus, MD [address] [facility] [place],  [place] [address]  Chief Complaint  Patient presents with   New Patient (Initial Visit)    HPI: Jeremy Castillo is a 57 y.o. male here to transfer care after recent retirement of his previous urologist.  Has been followed by Dr. Evelene Croon for history of stone disease and BPH He last saw Dr. Evelene Croon 10/2021 and PSA at that visit was 1.8. He underwent UroLift 12/2018 and developed recurrent hematuria 1 year later secondary to stone formation on a migrated UroLift implant  Cystolitholapaxy and removal of left bladder neck implant 01/2020 No bothersome LUTS Denies flank, abdominal or pelvic pain  Notes occasional decreased Wiley and caliber of his urinary stream   PMH: Past Medical History:  Diagnosis Date   Enlarged prostate    History of kidney stones    Kidney stones     Surgical History: Past Surgical History:  Procedure Laterality Date   COLONOSCOPY     COLONOSCOPY  2020   CYSTOSCOPY WITH INSERTION OF UROLIFT  12/2018   CYSTOSCOPY WITH INSERTION OF UROLIFT     CYSTOSCOPY WITH LITHOLAPAXY N/A 02/03/2020   Procedure: CYSTOSCOPY WITH LITHOLAPAXY W/HOLMIUM LASER;  Surgeon: Orson Ape, MD;  Location: [facility] OR[facility];  Service: Urology;  Laterality: N/A;   LITHOTRIPSY      Home Medications:  Allergies as of 10/30/2022   No Known Allergies      Medication List        Accurate as of October 30, 2022  9:16 AM. If you have any questions, ask your nurse or doctor.          STOP taking these medications    Uribel 118 MG Caps Stopped by: Jeremy Altes, MD       TAKE these  medications    COMPLETE MULTIVITAMIN/MINERAL PO Take 1 tablet by mouth.   Prostate 2.4 Caps Take 2 capsules by mouth.        Allergies: No Known Allergies  Social History:  reports that he has never smoked. He has never used smokeless tobacco. He reports current alcohol use of about 3.0 standard drinks of alcohol per week. He reports that he does not use drugs.   Physical Exam: BP (!) 157/97   Pulse 82   Ht  (1.702 m)   Wt 173 lb (78.5 kg)   BMI 27.10 kg/m   Constitutional:  Alert and oriented, No acute distress. HEENT: [place] AT Respiratory: Normal respiratory effort, no increased work of breathing. GU: prostate 30g, smooth without nodules Skin: No rashes, bruises or suspicious lesions. Neurologic: Grossly intact, no focal deficits, moving all 4 extremities. Psychiatric: Normal mood and affect.   Assessment & Plan:    BPH without LUTS Status post Urolift   2. Personal history urinary calculi Currently asymptomatic  3. Prostate cancer screening He desires to continue annual PSA/DRE PSA drawn today One year follow-up scheduled  I have reviewed the above documentation for accuracy and completeness, and I agree with the above.   Jeremy Altes, MD  [facility] [address], [address] [place], [place] [address] [phone]  [phone]  

## 2022-10-31 ENCOUNTER — Encounter: Payer: Self-pay | Admitting: Urology

## 2022-10-31 LAB — PSA: Prostate Specific Ag, Serum: 1.8 ng/mL (ref 0.0–4.0)

## 2023-10-30 ENCOUNTER — Encounter: Payer: Self-pay | Admitting: Urology

## 2023-10-30 ENCOUNTER — Ambulatory Visit: Payer: Self-pay | Admitting: Urology

## 2023-10-30 VITALS — BP 131/73 | HR 68 | Ht 67.0 in | Wt 175.0 lb

## 2023-10-30 DIAGNOSIS — N200 Calculus of kidney: Secondary | ICD-10-CM

## 2023-10-30 DIAGNOSIS — N400 Enlarged prostate without lower urinary tract symptoms: Secondary | ICD-10-CM | POA: Diagnosis not present

## 2023-10-30 DIAGNOSIS — Z125 Encounter for screening for malignant neoplasm of prostate: Secondary | ICD-10-CM

## 2023-10-30 NOTE — Progress Notes (Signed)
    I, Maysun Jamey Mccallum, acting as a scribe for Geraline Knapp, MD., have documented all relevant documentation on the behalf of Geraline Knapp, MD, as directed by Geraline Knapp, MD while in the presence of Geraline Knapp, MD.  10/30/2023 10:37 PM   Jeremy Castillo June 07, 1966 [id number]  Referring provider: Jimmy Moulding, MD [address] [facility] [place],  [place] [address]  Chief Complaint  Patient presents with   Benign Prostatic Hypertrophy   Urologic history 1. Recurrent nephrolithiasis Prior history of stone disease. CT 02/2021 showed a 4 mm UVJ calculus and a 3 mm right renal calculus; subsequently passed the ureteral calculus.  Bladder calculus on a migrated Urolift implant and underwent cystolitholapaxy and removal of a left bladder neck implant 01/2020.  2. BPH with LUTS Status post Urolift 01/07/2019  HPI: Jeremy Castillo is a 58 y.o. male presents for annual follow-up.   No significant problem since last year's visit. He has noted intermittent mild right flank pain, but no renal colic.  No bothersome LUTS He is taking a prostate supplement.  Denies gross hematuria.  PSA last year stable at 1.8   PMH: Past Medical History:  Diagnosis Date   Enlarged prostate    History of kidney stones    Kidney stones     Surgical History: Past Surgical History:  Procedure Laterality Date   COLONOSCOPY     COLONOSCOPY  2020   CYSTOSCOPY WITH INSERTION OF UROLIFT  12/2018   CYSTOSCOPY WITH INSERTION OF UROLIFT     CYSTOSCOPY WITH LITHOLAPAXY N/A 02/03/2020   Procedure: CYSTOSCOPY WITH LITHOLAPAXY W/HOLMIUM LASER;  Surgeon: Rea Cambridge, MD;  Location: [facility] OR[facility];  Service: Urology;  Laterality: N/A;   LITHOTRIPSY      Home Medications:  Allergies as of 10/30/2023   No Known Allergies      Medication List        Accurate as of October 30, 2023 10:37 PM. If you have any questions, ask your nurse or doctor.          COMPLETE  MULTIVITAMIN/MINERAL PO Take 1 tablet by mouth.   Prostate 2.4 Caps Take 2 capsules by mouth.        Allergies: No Known Allergies   Social History:  reports that he has never smoked. He has never used smokeless tobacco. He reports current alcohol use of about 3.0 standard drinks of alcohol per week. He reports that he does not use drugs.   Physical Exam: BP 131/73   Pulse 68   Ht 5\' 7"  (1.702 m)   Wt 175 lb (79.4 kg)   BMI 27.41 kg/m   Constitutional:  Alert and oriented, No acute distress. HEENT: [place] AT Respiratory: Normal respiratory effort, no increased work of breathing. GU: Prostate 30 grams, smooth without nodules, consistency is normal.  Psychiatric: Normal mood and affect.   Assessment & Plan:    1. Right nephrolithiasis Intermittent mild right flank pain. Last imaging was in 2023 with the K[facility] in Dr. Clint Dana office. Scheduled renal ultrasound.   2. BPH without LUTS Status post Urolift.  3. Prostate cancer screening Benign DRE PSA ordered today  Continue annual follow-up  I have reviewed the above documentation for accuracy and completeness, and I agree with the above.   Geraline Knapp, MD  [facility] [address], [address] [place], [place] [address] [phone]

## 2023-10-31 LAB — PSA: Prostate Specific Ag, Serum: 2.2 ng/mL (ref 0.0–4.0)

## 2023-11-03 ENCOUNTER — Encounter: Payer: Self-pay | Admitting: *Deleted

## 2023-11-03 ENCOUNTER — Encounter: Payer: Self-pay | Admitting: Urology

## 2023-11-06 ENCOUNTER — Ambulatory Visit
Admission: RE | Admit: 2023-11-06 | Discharge: 2023-11-06 | Disposition: A | Discharge status: Home / Self Care | Source: Ambulatory Visit | Attending: Urology | Admitting: Urology

## 2023-11-06 DIAGNOSIS — N200 Calculus of kidney: Secondary | ICD-10-CM | POA: Insufficient documentation

## 2023-11-10 ENCOUNTER — Encounter: Payer: Self-pay | Admitting: Urology

## 2023-11-12 ENCOUNTER — Other Ambulatory Visit: Payer: Self-pay | Admitting: *Deleted

## 2023-11-12 DIAGNOSIS — N200 Calculus of kidney: Secondary | ICD-10-CM

## 2023-11-13 ENCOUNTER — Ambulatory Visit
Admission: RE | Admit: 2023-11-13 | Discharge: 2023-11-13 | Disposition: A | Discharge status: Home / Self Care | Source: Ambulatory Visit | Attending: Urology | Admitting: Urology

## 2023-11-13 ENCOUNTER — Ambulatory Visit
Admission: RE | Admit: 2023-11-13 | Discharge: 2023-11-13 | Disposition: A | Discharge status: Home / Self Care | Attending: Urology | Admitting: Urology

## 2023-11-13 DIAGNOSIS — N200 Calculus of kidney: Secondary | ICD-10-CM | POA: Insufficient documentation

## 2023-11-26 ENCOUNTER — Ambulatory Visit: Payer: Self-pay | Admitting: Urology

## 2024-08-06 ENCOUNTER — Ambulatory Visit: Payer: Self-pay

## 2024-08-06 DIAGNOSIS — K5730 Diverticulosis of large intestine without perforation or abscess without bleeding: Secondary | ICD-10-CM | POA: Diagnosis not present

## 2024-08-06 DIAGNOSIS — Z1211 Encounter for screening for malignant neoplasm of colon: Secondary | ICD-10-CM | POA: Diagnosis present

## 2024-08-06 DIAGNOSIS — Z83719 Family history of colon polyps, unspecified: Secondary | ICD-10-CM | POA: Diagnosis not present

## 2024-08-06 DIAGNOSIS — K621 Rectal polyp: Secondary | ICD-10-CM | POA: Diagnosis not present

## 2024-08-06 DIAGNOSIS — K635 Polyp of colon: Secondary | ICD-10-CM | POA: Diagnosis not present

## 2024-10-29 ENCOUNTER — Ambulatory Visit: Admitting: Urology
